# Patient Record
Sex: Male | Born: 1970 | Race: Black or African American | Hispanic: No | Marital: Single | Smoking: Former smoker
Health system: Southern US, Community
[De-identification: ages and names within clinical notes are randomized; demographics above are authoritative.]

## PROBLEM LIST (undated history)

## (undated) DIAGNOSIS — E119 Type 2 diabetes mellitus without complications: Secondary | ICD-10-CM

## (undated) DIAGNOSIS — I10 Essential (primary) hypertension: Secondary | ICD-10-CM

## (undated) HISTORY — DX: Type 2 diabetes mellitus without complications: E11.9

---

## 2004-05-31 ENCOUNTER — Emergency Department (HOSPITAL_COMMUNITY): Admission: EM | Admit: 2004-05-31 | Discharge: 2004-05-31 | Payer: Self-pay | Admitting: Emergency Medicine

## 2005-11-11 ENCOUNTER — Emergency Department (HOSPITAL_COMMUNITY): Admission: EM | Admit: 2005-11-11 | Discharge: 2005-11-11 | Payer: Self-pay | Admitting: Emergency Medicine

## 2006-03-12 ENCOUNTER — Emergency Department: Payer: Self-pay | Admitting: Emergency Medicine

## 2007-05-21 ENCOUNTER — Emergency Department: Payer: Self-pay | Admitting: Emergency Medicine
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
## 2010-09-26 ENCOUNTER — Emergency Department (HOSPITAL_COMMUNITY)
Admission: EM | Admit: 2010-09-26 | Discharge: 2010-09-26 | Payer: Self-pay | Source: Home / Self Care | Admitting: Emergency Medicine

## 2010-09-30 LAB — URINALYSIS, ROUTINE W REFLEX MICROSCOPIC
Bilirubin Urine: NEGATIVE
Hgb urine dipstick: NEGATIVE
Ketones, ur: NEGATIVE mg/dL
Leukocytes, UA: NEGATIVE
Nitrite: NEGATIVE
Protein, ur: NEGATIVE mg/dL
Specific Gravity, Urine: 1.021 (ref 1.005–1.030)
Urine Glucose, Fasting: >1000 mg/dL — AB
Urobilinogen, UA: 1 mg/dL (ref 0.0–1.0)
pH: 6 (ref 5.0–8.0)

## 2010-09-30 LAB — POCT I-STAT, CHEM 8
BUN: 6 mg/dL (ref 6–23)
Calcium, Ion: 1.19 mmol/L (ref 1.12–1.32)
Chloride: 103 mEq/L (ref 96–112)
Creatinine, Ser: 1 mg/dL (ref 0.4–1.5)
Glucose, Bld: 340 mg/dL — ABNORMAL HIGH (ref 70–99)
HCT: 50 % (ref 39.0–52.0)
Hemoglobin: 17 g/dL (ref 13.0–17.0)
Potassium: 3.9 mEq/L (ref 3.5–5.1)
Sodium: 138 mEq/L (ref 135–145)
TCO2: 26 mmol/L (ref 0–100)

## 2010-09-30 LAB — GLUCOSE, CAPILLARY
Glucose-Capillary: 314 mg/dL — ABNORMAL HIGH (ref 70–99)
Glucose-Capillary: 393 mg/dL — ABNORMAL HIGH (ref 70–99)
Glucose-Capillary: 288 mg/dL — ABNORMAL HIGH (ref 70–99)

## 2010-09-30 LAB — URINE MICROSCOPIC-ADD ON

## 2013-04-04 ENCOUNTER — Ambulatory Visit: Payer: Self-pay

## 2013-04-15 ENCOUNTER — Ambulatory Visit: Payer: Self-pay

## 2013-04-25 DIAGNOSIS — R3915 Urgency of urination: Secondary | ICD-10-CM | POA: Insufficient documentation

## 2013-04-25 DIAGNOSIS — N411 Chronic prostatitis: Secondary | ICD-10-CM | POA: Insufficient documentation

## 2014-07-13 ENCOUNTER — Encounter (HOSPITAL_COMMUNITY): Payer: Self-pay | Admitting: Emergency Medicine

## 2014-07-13 ENCOUNTER — Emergency Department (HOSPITAL_COMMUNITY)
Admission: EM | Admit: 2014-07-13 | Discharge: 2014-07-13 | Disposition: A | Discharge status: Home / Self Care | Payer: BC Managed Care – PPO | Attending: Emergency Medicine | Admitting: Emergency Medicine

## 2014-07-13 DIAGNOSIS — N451 Epididymitis: Secondary | ICD-10-CM | POA: Insufficient documentation

## 2014-07-13 DIAGNOSIS — Z202 Contact with and (suspected) exposure to infections with a predominantly sexual mode of transmission: Secondary | ICD-10-CM | POA: Diagnosis present

## 2014-07-13 LAB — URINALYSIS, ROUTINE W REFLEX MICROSCOPIC
Bilirubin Urine: NEGATIVE
Glucose, UA: NEGATIVE mg/dL
Hgb urine dipstick: NEGATIVE
Ketones, ur: NEGATIVE mg/dL
Leukocytes, UA: NEGATIVE
Nitrite: NEGATIVE
Protein, ur: NEGATIVE mg/dL
Specific Gravity, Urine: 1.011 (ref 1.005–1.030)
Urobilinogen, UA: 0.2 mg/dL (ref 0.0–1.0)
pH: 5 (ref 5.0–8.0)

## 2014-07-13 MED ORDER — LIDOCAINE HCL 1 % IJ SOLN
INTRAMUSCULAR | Status: AC
  Administered 2014-07-13: 20 mL
  Filled 2014-07-13: qty 20

## 2014-07-13 MED ORDER — DOXYCYCLINE HYCLATE 100 MG PO CAPS: 100.0000 mg | ORAL_CAPSULE | Freq: Two times a day (BID) | ORAL | Status: DC

## 2014-07-13 MED ORDER — METRONIDAZOLE 500 MG PO TABS
2000.0000 mg | ORAL_TABLET | Freq: Once | ORAL | Status: AC
  Administered 2014-07-13: 2000 mg via ORAL
  Filled 2014-07-13: qty 4

## 2014-07-13 MED ORDER — CEFTRIAXONE SODIUM 250 MG IJ SOLR
250.0000 mg | Freq: Once | INTRAMUSCULAR | Status: AC
  Administered 2014-07-13: 250 mg via INTRAMUSCULAR
  Filled 2014-07-13: qty 250

## 2014-07-13 NOTE — ED Provider Notes (Signed)
Medical screening examination/treatment/procedure(s) were performed by non-physician practitioner and as supervising physician I was immediately available for consultation/collaboration.   EKG Interpretation None       Olivia Mackielga M Aryella Besecker, MD 07/13/14 [id number]

## 2014-07-13 NOTE — ED Notes (Signed)
Pt states he has burning and discharge from his penis  Pt states sxs started 2 days ago

## 2014-07-13 NOTE — ED Provider Notes (Signed)
CSN: [id number]     Arrival date & time 07/13/14  0223 History   First MD Initiated Contact with Patient 07/13/14 [phone]     Chief Complaint  Patient presents with  . SEXUALLY TRANSMITTED DISEASE   Patient is a 43 y.o. male presenting with penile discharge.  Penile Discharge This is a new problem. The current episode started in the past 7 days. Episode frequency: before and after urination. The problem has been gradually worsening. Pertinent negatives include no abdominal pain, anorexia, arthralgias, change in bowel habit, chest pain, chills, congestion, coughing, diaphoresis, fatigue, fever, headaches, joint swelling, myalgias, nausea, neck pain, numbness, rash, swollen glands, urinary symptoms, vertigo, visual change, vomiting or weakness.    History reviewed. No pertinent past medical history. History reviewed. No pertinent past surgical history. History reviewed. No pertinent family history. History  Substance Use Topics  . Smoking status: Never Smoker   . Smokeless tobacco: Not on file  . Alcohol Use: Yes    Review of Systems  Constitutional: Negative for fever, chills, diaphoresis and fatigue.  HENT: Negative for congestion.   Respiratory: Negative for cough.   Cardiovascular: Negative for chest pain.  Gastrointestinal: Negative for nausea, vomiting, abdominal pain, anorexia and change in bowel habit.  Genitourinary: Positive for dysuria, urgency, frequency, discharge and testicular pain. Negative for hematuria, flank pain, decreased urine volume, penile swelling, scrotal swelling, difficulty urinating, genital sores and penile pain.  Musculoskeletal: Negative for arthralgias, joint swelling, myalgias and neck pain.  Skin: Negative for rash.  Neurological: Negative for vertigo, weakness, numbness and headaches.  All other systems reviewed and are negative.     Allergies  Review of patient's allergies indicates no known allergies.  Home Medications   Prior to Admission  medications   Medication Sig Start Date End Date Taking? Authorizing Provider  doxycycline (VIBRAMYCIN) 100 MG capsule Take 1 capsule (100 mg total) by mouth 2 (two) times daily. 07/13/14   Coti Burd A Forcucci, PA-C   BP 123/77  Pulse 79  Temp(Src) 98 F (36.7 C) (Oral)  Resp 16  SpO2 97% Physical Exam  Nursing note and vitals reviewed. Constitutional: He is oriented to person, place, and time. He appears well-developed and well-nourished. No distress.  HENT:  Head: Normocephalic and atraumatic.  Mouth/Throat: Oropharynx is clear and moist. No oropharyngeal exudate.  Eyes: Conjunctivae and EOM are normal. Pupils are equal, round, and reactive to light. No scleral icterus.  Neck: Normal range of motion. Neck supple. No JVD present. No thyromegaly present.  Cardiovascular: Normal rate, regular rhythm, normal heart sounds and intact distal pulses.  Exam reveals no gallop and no friction rub.   No murmur heard. Pulmonary/Chest: Effort normal and breath sounds normal. No respiratory distress. He has no wheezes. He has no rales. He exhibits no tenderness.  Abdominal: Soft. Bowel sounds are normal. He exhibits no distension and no mass. There is no tenderness. There is no rebound and no guarding.  Genitourinary: Right testis shows tenderness. Right testis shows no mass and no swelling. Right testis is descended. Cremasteric reflex is not absent on the right side. Left testis shows no mass, no swelling and no tenderness. Left testis is descended. Cremasteric reflex is not absent on the left side. Circumcised. No phimosis, paraphimosis, hypospadias, penile erythema or penile tenderness. Discharge found.  Musculoskeletal: Normal range of motion.  Lymphadenopathy:    He has no cervical adenopathy.  Neurological: He is alert and oriented to person, place, and time.  Skin: Skin is warm and dry.  He is not diaphoretic.  Psychiatric: He has a normal mood and affect. His behavior is normal. Judgment and  thought content normal.    ED Course  Procedures (including critical care time) Labs Review Labs Reviewed  GC/CHLAMYDIA PROBE AMP  URINALYSIS, ROUTINE W REFLEX MICROSCOPIC    Imaging Review No results found.   EKG Interpretation None      MDM   Final diagnoses:  Epididymitis, right   Patient is a 43 y.o. Male who presents to the ED with penile discharge.  Physical exam reveals mildly tender right testicle with no other acute abnormalities.  UA is pending.  GC is pending.  Will treat presumptively with 250 mg ceftriaxone IM, 2 g flagyl, and doxycycline 100 mg PO BID.  Patient to return for worsening testicular pain, fever, chills, nausea, or vomiting.  [facility]  Patient to abstain from sexual activity until he no longer has any further symptoms and finishes the course of his antibiotics.  Patient discussed with Dr. Norlene Campbelltter who agrees with the above therapy and discharge.      Eben Burowourtney A Forcucci, PA-C 07/13/14 [phone]

## 2014-07-13 NOTE — Discharge Instructions (Signed)
Epididymitis Epididymitis is a swelling (inflammation) of the epididymis. The epididymis is a cord-like structure along the back part of the testicle. Epididymitis is usually, but not always, caused by infection. This is usually a sudden problem beginning with chills, fever and pain behind the scrotum and in the testicle. There may be swelling and redness of the testicle. DIAGNOSIS  Physical examination will reveal a tender, swollen epididymis. Sometimes, cultures are obtained from the urine or from prostate secretions to help find out if there is an infection or if the cause is a different problem. Sometimes, blood work is performed to see if your white blood cell count is elevated and if a germ (bacterial) or viral infection is present. Using this knowledge, an appropriate medicine which kills germs (antibiotic) can be chosen by your caregiver. A viral infection causing epididymitis will most often go away (resolve) without treatment. HOME CARE INSTRUCTIONS   Hot sitz baths for 20 minutes, 4 times per day, may help relieve pain.  Only take over-the-counter or prescription medicines for pain, discomfort or fever as directed by your caregiver.  Take all medicines, including antibiotics, as directed. Take the antibiotics for the full prescribed length of time even if you are feeling better.  It is very important to keep all follow-up appointments. SEEK IMMEDIATE MEDICAL CARE IF:   You have a fever.  You have pain not relieved with medicines.  You have any worsening of your problems.  Your pain seems to come and go.  You develop pain, redness, and swelling in the scrotum and surrounding areas. MAKE SURE YOU:   Understand these instructions.  Will watch your condition.  Will get help right away if you are not doing well or get worse. Document Released: 08/29/2000 Document Revised: 11/24/2011 Document Reviewed: 07/19/2009 [facility] Patient Information 2015 [place]Care, [place]. This information  is not intended to replace advice given to you by your health care provider. Make sure you discuss any questions you have with your health care provider.  Sexually Transmitted Disease A sexually transmitted disease (STD) is a disease or infection that may be passed (transmitted) from person to person, usually during sexual activity. This may happen by way of saliva, semen, blood, vaginal mucus, or urine. Common STDs include:   Gonorrhea.   Chlamydia.   Syphilis.   HIV and AIDS.   Genital herpes.   Hepatitis B and C.   Trichomonas.   Human papillomavirus (HPV).   Pubic lice.   Scabies.  Mites.  Bacterial vaginosis. WHAT ARE CAUSES OF STDs? An STD may be caused by bacteria, a virus, or parasites. STDs are often transmitted during sexual activity if one person is infected. However, they may also be transmitted through nonsexual means. STDs may be transmitted after:   Sexual intercourse with an infected person.   Sharing sex toys with an infected person.   Sharing needles with an infected person or using unclean piercing or tattoo needles.  Having intimate contact with the genitals, mouth, or rectal areas of an infected person.   Exposure to infected fluids during birth. WHAT ARE THE SIGNS AND SYMPTOMS OF STDs? Different STDs have different symptoms. Some people may not have any symptoms. If symptoms are present, they may include:   Painful or bloody urination.   Pain in the pelvis, abdomen, vagina, anus, throat, or eyes.   A skin rash, itching, or irritation.  Growths, ulcerations, blisters, or sores in the genital and anal areas.  Abnormal vaginal discharge with or without bad odor.  Penile discharge in men.   Fever.   Pain or bleeding during sexual intercourse.   Swollen glands in the groin area.   Yellow skin and eyes (jaundice). This is seen with hepatitis.   Swollen testicles.  Infertility.  Sores and blisters in the  mouth. HOW ARE STDs DIAGNOSED? To make a diagnosis, your health care provider may:   Take a medical history.   Perform a physical exam.   Take a sample of any discharge to examine.  Swab the throat, cervix, opening to the penis, rectum, or vagina for testing.  Test a sample of your first morning urine.   Perform blood tests.   Perform a Pap test, if this applies.   Perform a colposcopy.   Perform a laparoscopy.  HOW ARE STDs TREATED? Treatment depends on the STD. Some STDs may be treated but not cured.   Chlamydia, gonorrhea, trichomonas, and syphilis can be cured with antibiotic medicine.   Genital herpes, hepatitis, and HIV can be treated, but not cured, with prescribed medicines. The medicines lessen symptoms.   Genital warts from HPV can be treated with medicine or by freezing, burning (electrocautery), or surgery. Warts may come back.   HPV cannot be cured with medicine or surgery. However, abnormal areas may be removed from the cervix, vagina, or vulva.   If your diagnosis is confirmed, your recent sexual partners need treatment. This is true even if they are symptom-free or have a negative culture or evaluation. They should not have sex until their health care providers say it is okay. HOW CAN I REDUCE MY RISK OF GETTING AN STD? Take these steps to reduce your risk of getting an STD:  Use latex condoms, dental dams, and water-soluble lubricants during sexual activity. Do not use petroleum jelly or oils.  Avoid having multiple sex partners.  Do not have sex with someone who has other sex partners.  Do not have sex with anyone you do not know or who is at high risk for an STD.  Avoid risky sex practices that can break your skin.  Do not have sex if you have open sores on your mouth or skin.  Avoid drinking too much alcohol or taking illegal drugs. Alcohol and drugs can affect your judgment and put you in a vulnerable position.  Avoid engaging in oral  and anal sex acts.  Get vaccinated for HPV and hepatitis. If you have not received these vaccines in the past, talk to your health care provider about whether one or both might be right for you.   If you are at risk of being infected with HIV, it is recommended that you take a prescription medicine daily to prevent HIV infection. This is called pre-exposure prophylaxis (PrEP). You are considered at risk if:  You are a man who has sex with other men (MSM).  You are a heterosexual man or woman and are sexually active with more than one partner.  You take drugs by injection.  You are sexually active with a partner who has HIV.  Talk with your health care provider about whether you are at high risk of being infected with HIV. If you choose to begin PrEP, you should first be tested for HIV. You should then be tested every 3 months for as long as you are taking PrEP.  WHAT SHOULD I DO IF I THINK I HAVE AN STD?  See your health care provider.   Tell your sexual partner(s). They should be tested and treated  for any STDs.  Do not have sex until your health care provider says it is okay. WHEN SHOULD I GET IMMEDIATE MEDICAL CARE? Contact your health care provider right away if:   You have severe abdominal pain.  You are a man and notice swelling or pain in your testicles.  You are a woman and notice swelling or pain in your vagina. Document Released: 11/22/2002 Document Revised: 09/06/2013 Document Reviewed: 03/22/2013 [facility] Patient Information 2015 [place]ExitCare, [place]LLC. This information is not intended to replace advice given to you by your health care provider. Make sure you discuss any questions you have with your health care provider.   Emergency Department Resource Guide 1) Find a Doctor and Pay Out of Pocket Although you won't have to find out who is covered by your insurance plan, it is a good idea to ask around and get recommendations. You will then need to call the office and see  if the doctor you have chosen will accept you as a new patient and what types of options they offer for patients who are self-pay. Some doctors offer discounts or will set up payment plans for their patients who do not have insurance, but you will need to ask so you aren't surprised when you get to your appointment.  2) Contact Your Local Health Department Not all health departments have doctors that can see patients for sick visits, but many do, so it is worth a call to see if yours does. If you don't know where your local health department is, you can check in your phone book. The CDC also has a tool to help you locate your state's health department, and many state websites also have listings of all of their local health departments.  3) Find a Walk-in Clinic If your illness is not likely to be very severe or complicated, you may want to try a walk in clinic. These are popping up all over the country in pharmacies, drugstores, and shopping centers. They're usually staffed by nurse practitioners or physician assistants that have been trained to treat common illnesses and complaints. They're usually fairly quick and inexpensive. However, if you have serious medical issues or chronic medical problems, these are probably not your best option.  No Primary Care Doctor: - Call Health Connect at  [phone] - they can help you locate a primary care doctor that  accepts your insurance, provides certain services, etc. - Physician Referral Service- [phone]  Chronic Pain Problems: Organization         Address  Phone   Notes  [facility]  [phone] Patients need to be referred by their primary care doctor.   Medication Assistance: Organization         Address  Phone   Notes  [facility] Medication [facility] [address] [place], [place] [address] [phone] --Must be a resident of [facility] -- Must have NO insurance coverage whatsoever (no  Medicaid/ Medicare, etc.) -- The pt. MUST have a primary care doctor that directs their care regularly and follows them in the community   MedAssist  [phone]   [employer]  [phone]    Agencies that provide inexpensive medical care: Organization         Address  Phone   Notes  [facility]  [phone]   [facility]    [phone]    [facility] [address] [place], [place] [address] [phone]   [facility] [address] [place] [phone]   Planned Parenthood    [phone]   [facility]    [phone]   [facility]  [address], [place] Phone:  [phone], Fax:  [phone] Hours of Operation:  9 am - 6 pm, M-F.  Also accepts Medicaid/Medicare and self-pay.  [facility]  [address], [place] Phone: [phone], Fax: [phone]. Hours of Operation:  8:30 am - 5:30 pm, M-F.  Also accepts Medicaid and self-pay.  [facility] [place] [address], [place] Phone: [phone]   [facility] [address] Natasha Bence [place], [place] [phone], Ext. [phone] Mondays & Thursdays: 7-9 AM.  First 15 patients are seen on a first come, first serve basis.    Medicaid-accepting [facility] Providers:  Organization         Address  Phone   Notes  [facility] [address], [place] [phone] Also accepts self-pay patients.  [facility] [address] Laurell Josephs [place], [place]  [phone]   [facility] [address], [place] [phone]   [facility] [address] [place] [phone]   Renaye Rakers [address], [place]   [phone] Only accepts [place] Access [place] patients after they have their name applied to their card.    Self-Pay (no insurance) in [facility]:  Organization         Address  Phone   Notes  Sickle Cell Patients, [facility] [address] [place], [place] [phone]   [facility] [address] [place], [place] [phone]   [facility] [place]  [address], [place] [phone]   [facility]/Dr. Osei-Bonsu  [address] [place] or [address], [place] [phone] Phone number for both [place] and [place] locations is the same.  [facility] [address], [place] [phone]   [facility] [address] [place] or [address] [phone]   [facility] [address], [place] [phone], phone; [phone], fax Sees patients 1st and 3rd Saturday of every month.  Must not qualify for public or private insurance (i.e. Medicaid, Medicare, [place] Health Choice, Veterans' Benefits)  Household income should be no more than 200% of the poverty level The clinic cannot treat you if you are pregnant or think you are pregnant  Sexually transmitted diseases are not treated at the clinic.    Dental Care: Organization         Address  Phone  Notes  [facility] [address] [place], [place] [phone] Accepts children up to age 17 who are enrolled in [place] or [place] Health Choice; pregnant women with a Medicaid card; and children who have applied for Medicaid or [place] Health Choice, but were declined, whose parents can pay a reduced fee at time of service.  [facility]  [address], [place] [phone] Accepts children up to age 74 who are enrolled in [place] or [place] Health Choice; pregnant women with a  Medicaid card; and children who have applied for Medicaid or [place] Health Choice,  but were declined, whose parents can pay a reduced fee at time of service.  [facility]  [address], [place] [phone] Patients are seen by appointment only. Walk-ins are not accepted. [facility] will see patients 45 years of age and older. Monday - Tuesday (8am-5pm) Most Wednesdays (8:30-5pm) $30 per visit, cash only  [facility]  [address], [facility] [place] [phone] Patients are seen by appointment only. Walk-ins are not accepted. [facility] will see patients 518 years of age and older. One Wednesday Evening (Monthly: Volunteer Based).  $30 per visit, cash only  [employer] [facility] [facility]  [phone] for adults; Children under age 44, call Graduate Pediatric Dentistry at [phone]. Children aged 324-14, please call [phone] to request a pediatric application.  Dental services are provided in all areas of dental care including fillings, crowns and bridges, complete and partial dentures, implants, gum treatment, root canals, and extractions. Preventive care is also provided. Treatment is provided to both adults and children. Patients are selected via a lottery and there is often a waiting list.   [facility] [address], [place]  [phone] [email or web address]   [facility] [address], [place], [place] [phone], Ext. [phone] Second and Fourth Thursday of each month, opens at 6:30 AM; Clinic ends at 9 AM.  Patients are seen on a first-come first-served basis, and a limited number are seen during each clinic.   [facility]  [address], [place], [place] [phone]   Eligibility Requirements You must have lived in [place], [place], or [place] counties for at least the last three months.   You cannot be eligible for state or federal sponsored National Cityhealthcare insurance, including CIGNAVeterans Administration, [place]icaid, or Harrah[employer] EntertainmentMedicare.   You generally  cannot be eligible for healthcare insurance through your employer.    How to apply: Eligibility screenings are held every Tuesday and Wednesday afternoon from 1:00 pm until 4:00 pm. You do not need an appointment for the interview!  [facility] [address], [place], [place] [phone]   [facility]  [phone]   [facility]  [phone]   [facility]  [phone]    Behavioral Health Resources in the Community: Intensive Outpatient Programs Organization         Address  Phone  Notes  [facility] [address], [place], [place] [phone]   [facility] [address], [place], [place] [phone]   [facility] [address], [place], [place]  [phone]   [facility] [address],  [place], [place] [phone] or [phone]   Substance Abuse Resources Organization         Address  Phone  Notes  [facility] Drug Services  [phone]   [facility]  [phone]   [facility]  [phone]   [facility]  [phone]   Residential & Outpatient Substance Abuse Program  [phone]   Psychological Services Organization         Address  Phone  Notes  [facility]  [phone]- [phone]   [facility]  [phone]- [phone]   [facility] [address], [place] [phone] or [phone]    Mobile Crisis Teams Organization  Address  Phone  Notes  [facility], Mobile Crisis Care Unit  [phone]   [facility]otherapeutic [facility]  [address] [place], [place]   [facility] [address], [place] [phone]    Self-Help/Support Groups Organization         Address  Phone             Notes  [place] of [place] - variety of support groups  [place] Call for more  information  Narcotics Anonymous (NA), [facility] [address], [employer] [place]  2 meetings at this location   Special educational needs teacher         Address  Phone  Notes  [facility]AP Residential Treatment [place],    [place]  [phone]   [facility]  [address] [place] [phone], [place], [place]   [place], [place] [phone] Admissions: 8am-3pm M-F  Incentives Substance [place] [address]    [place], [place] [phone]   [facility] [address] [place], [place], [place]   [facility] [address]  [place], [place]   [facility] - Intensive Outpatient [address], [place], [place]   [facility] ([place].,  [place], [place] [phone] or [phone]   [facility] (RTS) [address] [place], [place] Accepts Medicaid  [facility] [address],  [place] [phone] Substance Abuse/Addiction Treatment   [facility] Organization         Address  Phone  Notes  [facility]  [phone]   Domenic Schwab, PhD [address] Arlis Porta [place], [place]   [phone] or [phone]   [place], [place] [phone]   [facility] [address] [place], [place] [phone] Insurance/Medicaid/sponsorship through [facility] and Families [address], [address]                                    [place], [place] [phone] [place] [address], [place] [phone]    Dr. Adele Schilder  [phone]   [facility] Dept. 1) [address] [place] 2) [place] 3)  [place] [address], [place] [phone]  [phone]   [place] [phone] or [phone] (After Hours)

## 2014-07-14 LAB — GC/CHLAMYDIA PROBE AMP
CT Probe RNA: NEGATIVE
GC Probe RNA: NEGATIVE

## 2014-09-26 ENCOUNTER — Ambulatory Visit (INDEPENDENT_AMBULATORY_CARE_PROVIDER_SITE_OTHER): Payer: BLUE CROSS/BLUE SHIELD | Admitting: Internal Medicine

## 2014-09-26 ENCOUNTER — Encounter: Payer: Self-pay | Admitting: Internal Medicine

## 2014-09-26 VITALS — BP 118/84 | HR 84 | Temp 98.0°F | Ht 65.5 in | Wt 178.0 lb

## 2014-09-26 DIAGNOSIS — E119 Type 2 diabetes mellitus without complications: Secondary | ICD-10-CM

## 2014-09-26 DIAGNOSIS — E1165 Type 2 diabetes mellitus with hyperglycemia: Secondary | ICD-10-CM | POA: Insufficient documentation

## 2014-09-26 DIAGNOSIS — Z13220 Encounter for screening for lipoid disorders: Secondary | ICD-10-CM

## 2014-09-26 DIAGNOSIS — R300 Dysuria: Secondary | ICD-10-CM

## 2014-09-26 LAB — HEMOGLOBIN A1C: Hgb A1c MFr Bld: 10.2 % — ABNORMAL HIGH (ref 4.6–6.5)

## 2014-09-26 LAB — COMPREHENSIVE METABOLIC PANEL
ALT: 21 U/L (ref 0–53)
AST: 19 U/L (ref 0–37)
Albumin: 3.9 g/dL (ref 3.5–5.2)
Alkaline Phosphatase: 43 U/L (ref 39–117)
BUN: 11 mg/dL (ref 6–23)
CO2: 27 mEq/L (ref 19–32)
Calcium: 9 mg/dL (ref 8.4–10.5)
Chloride: 99 mEq/L (ref 96–112)
Creatinine, Ser: 1.1 mg/dL (ref 0.4–1.5)
GFR: 91.88 mL/min (ref >60.00)
Glucose, Bld: 451 mg/dL — ABNORMAL HIGH (ref 70–99)
Potassium: 3.7 mEq/L (ref 3.5–5.1)
Sodium: 130 mEq/L — ABNORMAL LOW (ref 135–145)
Total Bilirubin: 1.7 mg/dL — ABNORMAL HIGH (ref 0.2–1.2)
Total Protein: 6.9 g/dL (ref 6.0–8.3)

## 2014-09-26 LAB — LIPID PANEL
Cholesterol: 182 mg/dL (ref 0–200)
HDL: 44.9 mg/dL (ref >39.00)
LDL Cholesterol: 99 mg/dL (ref 0–99)
NonHDL: 137.1
Total CHOL/HDL Ratio: 4
Triglycerides: 191 mg/dL — ABNORMAL HIGH (ref 0.0–149.0)
VLDL: 38.2 mg/dL (ref 0.0–40.0)

## 2014-09-26 LAB — CBC
HCT: 47.3 % (ref 39.0–52.0)
Hemoglobin: 15.7 g/dL (ref 13.0–17.0)
MCHC: 33.2 g/dL (ref 30.0–36.0)
MCV: 94.5 fl (ref 78.0–100.0)
Platelets: 179 10*3/uL (ref 150.0–400.0)
RBC: 5.01 Mil/uL (ref 4.22–5.81)
RDW: 12.7 % (ref 11.5–15.5)
WBC: 5.9 10*3/uL (ref 4.0–10.5)

## 2014-09-26 NOTE — Progress Notes (Signed)
Pre visit review using our clinic review tool, if applicable. No additional management support is needed unless otherwise documented below in the visit note. 

## 2014-09-26 NOTE — Patient Instructions (Signed)

## 2014-09-26 NOTE — Progress Notes (Signed)
HPI  Pt presents to the clinic today to establish care. His last PCP was at [facility] but he is not sure of his providers name.  DM2: Diagnosed in 2012. Was on Glipizide and Metformin at one time but reports he stopped his medication 8 months ago. He did join a gym but did not lose weight but reports he thought he could just stop his medication. He does check his sugars post prandial. They range 160-330.  Additionally, he c/u dysuria. He reports this started 2-3 months ago. He was seen in the ED 06/2014 and treated with antibiotics but reports he continues to have dysuria. He denies pain in his testicles. He denies penile discharge. He denies fever, chills or nausea. He has not tried anything OTC.  Flu: never Pneumovax: never Tetanus: > 10 years Vision: yearly Dentist: biannually  Past Medical History  Diagnosis Date  . Diabetes mellitus without complication     No current outpatient prescriptions on file.   No current facility-administered medications for this visit.    No Known Allergies  No family history on file.  History   Social History  . Marital Status: Single    Spouse Name: N/A    Number of Children: N/A  . Years of Education: N/A   Occupational History  . Not on file.   Social History Main Topics  . Smoking status: Never Smoker   . Smokeless tobacco: Never Used  . Alcohol Use: 0.0 oz/week    0 Not specified per week     Comment: occasional  . Drug Use: No  . Sexual Activity: Yes    Birth Control/ Protection: None   Other Topics Concern  . Not on file   Social History Narrative    ROS:  Constitutional: Denies fever, malaise, fatigue, headache or abrupt weight changes.  HEENT: Pt reports blurred vision. Denies eye pain, eye redness, ear pain, ringing in the ears, wax buildup, runny nose, nasal congestion, bloody nose, or sore throat. Respiratory: Denies difficulty breathing, shortness of breath, cough or sputum production.   Cardiovascular:  Denies chest pain, chest tightness, palpitations or swelling in the hands or feet.  Gastrointestinal: Pt reports increased thirst and polyuria. Denies abdominal pain, bloating, constipation, diarrhea or blood in the stool.  GU: Pt reports dysuria. Denies frequency, urgency, blood in urine, odor or discharge. Musculoskeletal: Denies decrease in range of motion, difficulty with gait, muscle pain or joint pain and swelling.  Skin: Denies redness, rashes, lesions or ulcercations.  Neurological: Pt reports numbness and tingling in arms. Denies dizziness, difficulty with memory, difficulty with speech or problems with balance and coordination.   No other specific complaints in a complete review of systems (except as listed in HPI above).  PE:  BP 118/84 mmHg  Pulse 84  Temp(Src) 98 F (36.7 C) (Oral)  Ht 5' 5.5" (1.664 m)  Wt 178 lb (80.74 kg)  BMI 29.16 kg/m2  SpO2 98%  Wt Readings from Last 3 Encounters:  09/26/14 178 lb (80.74 kg)    General: Appears his stated age, well developed, well nourished in NAD. Skin: Warm, dry and intact. No lesion or ulcerations noted. HEENT: Head: normal shape and size; Eyes: sclera white, no icterus, conjunctiva pink, PERRLA and EOMs intact;  Cardiovascular: Normal rate and rhythm. S1,S2 noted.  No murmur, rubs or gallops noted. No JVD or BLE edema. No carotid bruits noted. Pulmonary/Chest: Normal effort and positive vesicular breath sounds. No respiratory distress. No wheezes, rales or ronchi noted.  Abdomen:  Soft and nontender. Normal bowel sounds, no bruits noted. No distention or masses noted. Liver, spleen and kidneys non palpable. Neurological: Alert and oriented. Sensation intact to BUE/BLE. Coordination normal.    BMET    Component Value Date/Time   NA 138 09/26/2010 1027   K 3.9 09/26/2010 1027   CL 103 09/26/2010 1027   GLUCOSE 340* 09/26/2010 1027   BUN 6 09/26/2010 1027   CREATININE 1.0 09/26/2010 1027    Lipid Panel  No results  found for: CHOL, TRIG, HDL, CHOLHDL, VLDL, LDLCALC  CBC    Component Value Date/Time   HGB 17.0 09/26/2010 1027   HCT 50.0 09/26/2010 1027    Hgb A1C No results found for: HGBA1C   Assessment and Plan:  Dysuria:  Urinalysis: > 3000 glucose, + ketones No infection noted Likely d/t ketones Push fluids We will work on diabetes control

## 2014-09-26 NOTE — Assessment & Plan Note (Addendum)
Will check A1C today Discussed low carb diet and exercise He is getting yearly eye exam Foot exam today He declines flu, tetanus and pneumonia today  If A1C elevated, will restart metformin and glipizide  RTC in 3 months to recheck A1C

## 2014-09-27 MED ORDER — METFORMIN HCL 1000 MG PO TABS: ORAL_TABLET | ORAL | Status: DC

## 2014-09-27 NOTE — Addendum Note (Signed)
Addended by: Roena MaladyEVONTENNO, Xanthe Couillard Y on: 09/27/2014 04:53 PM   Modules accepted: Orders

## 2014-10-30 LAB — POCT URINALYSIS DIPSTICK
Bilirubin, UA: NEGATIVE
Blood, UA: NEGATIVE
Glucose, UA: 3000
Ketones, UA: POSITIVE
Leukocytes, UA: NEGATIVE
Nitrite, UA: NEGATIVE
Spec Grav, UA: 1.015
Urobilinogen, UA: NEGATIVE
pH, UA: 6

## 2014-10-30 NOTE — Addendum Note (Signed)
Addended by: Roena MaladyEVONTENNO, MELANIE Y on: 10/30/2014 12:11 PM   Modules accepted: Orders

## 2014-12-26 ENCOUNTER — Ambulatory Visit (INDEPENDENT_AMBULATORY_CARE_PROVIDER_SITE_OTHER): Payer: BLUE CROSS/BLUE SHIELD | Admitting: Internal Medicine

## 2014-12-26 ENCOUNTER — Encounter: Payer: Self-pay | Admitting: Internal Medicine

## 2014-12-26 VITALS — BP 130/76 | HR 93 | Temp 98.2°F | Wt 174.0 lb

## 2014-12-26 DIAGNOSIS — E781 Pure hyperglyceridemia: Secondary | ICD-10-CM | POA: Diagnosis not present

## 2014-12-26 DIAGNOSIS — E119 Type 2 diabetes mellitus without complications: Secondary | ICD-10-CM

## 2014-12-26 LAB — HEMOGLOBIN A1C: Hgb A1c MFr Bld: 9.1 % — ABNORMAL HIGH (ref 4.6–6.5)

## 2014-12-26 LAB — LIPID PANEL
Cholesterol: 155 mg/dL (ref 0–200)
HDL: 49.9 mg/dL (ref >39.00)
LDL Cholesterol: 80 mg/dL (ref 0–99)
NonHDL: 105.1
Total CHOL/HDL Ratio: 3
Triglycerides: 125 mg/dL (ref 0.0–149.0)
VLDL: 25 mg/dL (ref 0.0–40.0)

## 2014-12-26 LAB — MICROALBUMIN / CREATININE URINE RATIO
Creatinine,U: 37.5 mg/dL
Microalb Creat Ratio: 12.5 mg/g (ref 0.0–30.0)
Microalb, Ur: 4.7 mg/dL — ABNORMAL HIGH (ref 0.0–1.9)

## 2014-12-26 NOTE — Progress Notes (Signed)
Subjective:    Patient ID: Gordon Sexton, male    DOB: 09/14/1971, 44 y.o.   MRN: [id number]  HPI  Pt presents to the clinic today for 3 month follow up of  DM2. He was restarted on Metformin 09/26/14 for an A1C of 10.2%. At that time, he also had some elevated triglycerides of 191, LDL of 99. He was instructed to start taking Fish Oil daily. He has been taking both medications as directed. He denies adverse effects. His sugars range 80-300. Eye exam was 1 year ago. Microalbumin has been > 1 year ago. Flu shot declined: Pneumovax: declined.   Review of Systems      Past Medical History  Diagnosis Date  . Diabetes mellitus without complication     Current Outpatient Prescriptions  Medication Sig Dispense Refill  . metFORMIN (GLUCOPHAGE) 1000 MG tablet 1/2 tablet two times daily for 2 weeks then increase to 1 tablet two times daily 60 tablet 2   No current facility-administered medications for this visit.    No Known Allergies  Family History  Problem Relation Age of Onset  . Diabetes Sister   . Stroke Maternal Grandmother   . Cancer Neg Hx   . Hyperlipidemia Neg Hx   . Heart disease Neg Hx     History   Social History  . Marital Status: Single    Spouse Name: N/A  . Number of Children: N/A  . Years of Education: N/A   Occupational History  . Not on file.   Social History Main Topics  . Smoking status: Never Smoker   . Smokeless tobacco: Never Used  . Alcohol Use: 0.0 oz/week    0 Standard drinks or equivalent per week     Comment: occasional  . Drug Use: No  . Sexual Activity: Yes    Birth Control/ Protection: None   Other Topics Concern  . Not on file   Social History Narrative     Constitutional: Denies fever, malaise, fatigue, headache or abrupt weight changes.  HEENT: Pt reports blurred vision. Denies eye pain, eye redness, ear pain, ringing in the ears, wax buildup, runny nose, nasal congestion, bloody nose, or sore throat. Respiratory: Denies  difficulty breathing, shortness of breath, cough or sputum production.   Cardiovascular: Denies chest pain, chest tightness, palpitations or swelling in the hands or feet.  Skin: Denies redness, rashes, lesions or ulcercations.  Neurological: Denies dizziness, difficulty with memory, difficulty with speech or problems with balance and coordination.   No other specific complaints in a complete review of systems (except as listed in HPI above).  Objective:   Physical Exam  BP 130/76 mmHg  Pulse 93  Temp(Src) 98.2 F (36.8 C) (Oral)  Wt 174 lb (78.926 kg)  SpO2 98% Wt Readings from Last 3 Encounters:  12/26/14 174 lb (78.926 kg)  09/26/14 178 lb (80.74 kg)    General: Appears his stated age, well developed, well nourished in NAD. Skin: Warm, dry and intact. No rashes, lesions or ulcerations noted. HEENT: Head: normal shape and size; Eyes: sclera white, no icterus, conjunctiva pink, PERRLA and EOMs intact; Cardiovascular: Normal rate and rhythm. S1,S2 noted.  No murmur, rubs or gallops noted.  Pulmonary/Chest: Normal effort and positive vesicular breath sounds. No respiratory distress. No wheezes, rales or ronchi noted.  Neurological: Alert and oriented. Sensation intact to BLE.   BMET    Component Value Date/Time   NA 130* 09/26/2014 1025   K 3.7 09/26/2014 1025   CL 99  09/26/2014 1025   CO2 27 09/26/2014 1025   GLUCOSE 451* 09/26/2014 1025   BUN 11 09/26/2014 1025   CREATININE 1.1 09/26/2014 1025   CALCIUM 9.0 09/26/2014 1025    Lipid Panel     Component Value Date/Time   CHOL 182 09/26/2014 1025   TRIG 191.0* 09/26/2014 1025   HDL 44.90 09/26/2014 1025   CHOLHDL 4 09/26/2014 1025   VLDL 38.2 09/26/2014 1025   LDLCALC 99 09/26/2014 1025    CBC    Component Value Date/Time   WBC 5.9 09/26/2014 1025   RBC 5.01 09/26/2014 1025   HGB 15.7 09/26/2014 1025   HCT 47.3 09/26/2014 1025   PLT 179.0 09/26/2014 1025   MCV 94.5 09/26/2014 1025   MCHC 33.2 09/26/2014  1025   RDW 12.7 09/26/2014 1025    Hgb A1C Lab Results  Component Value Date   HGBA1C 10.2* 09/26/2014         Assessment & Plan:

## 2014-12-26 NOTE — Assessment & Plan Note (Signed)
Will repeat lipid profile today Handout given on low fat diet Continue Fish oil daily

## 2014-12-26 NOTE — Patient Instructions (Signed)
Fat and Cholesterol Control Diet Fat and cholesterol levels in your blood and organs are influenced by your diet. High levels of fat and cholesterol may lead to diseases of the heart, small and large blood vessels, gallbladder, liver, and pancreas. CONTROLLING FAT AND CHOLESTEROL WITH DIET Although exercise and lifestyle factors are important, your diet is key. That is because certain foods are known to raise cholesterol and others to lower it. The goal is to balance foods for their effect on cholesterol and more importantly, to replace saturated and trans fat with other types of fat, such as monounsaturated fat, polyunsaturated fat, and omega-3 fatty acids. On average, a person should consume no more than 15 to 17 g of saturated fat daily. Saturated and trans fats are considered "bad" fats, and they will raise LDL cholesterol. Saturated fats are primarily found in animal products such as meats, butter, and cream. However, that does not mean you need to give up all your favorite foods. Today, there are good tasting, low-fat, low-cholesterol substitutes for most of the things you like to eat. Choose low-fat or nonfat alternatives. Choose round or loin cuts of red meat. These types of cuts are lowest in fat and cholesterol. Chicken (without the skin), fish, veal, and ground turkey breast are great choices. Eliminate fatty meats, such as hot dogs and salami. Even shellfish have little or no saturated fat. Have a 3 oz (85 g) portion when you eat lean meat, poultry, or fish. Trans fats are also called "partially hydrogenated oils." They are oils that have been scientifically manipulated so that they are solid at room temperature resulting in a longer shelf life and improved taste and texture of foods in which they are added. Trans fats are found in stick margarine, some tub margarines, cookies, crackers, and baked goods.  When baking and cooking, oils are a great substitute for butter. The monounsaturated oils are  especially beneficial since it is believed they lower LDL and raise HDL. The oils you should avoid entirely are saturated tropical oils, such as coconut and palm.  Remember to eat a lot from food groups that are naturally free of saturated and trans fat, including fish, fruit, vegetables, beans, grains (barley, rice, couscous, bulgur wheat), and pasta (without cream sauces).  IDENTIFYING FOODS THAT LOWER FAT AND CHOLESTEROL  Soluble fiber may lower your cholesterol. This type of fiber is found in fruits such as apples, vegetables such as broccoli, potatoes, and carrots, legumes such as beans, peas, and lentils, and grains such as barley. Foods fortified with plant sterols (phytosterol) may also lower cholesterol. You should eat at least 2 g per day of these foods for a cholesterol lowering effect.  Read package labels to identify low-saturated fats, trans fat free, and low-fat foods at the supermarket. Select cheeses that have only 2 to 3 g saturated fat per ounce. Use a heart-healthy tub margarine that is free of trans fats or partially hydrogenated oil. When buying baked goods (cookies, crackers), avoid partially hydrogenated oils. Breads and muffins should be made from whole grains (whole-wheat or whole oat flour, instead of "flour" or "enriched flour"). Buy non-creamy canned soups with reduced salt and no added fats.  FOOD PREPARATION TECHNIQUES  Never deep-fry. If you must fry, either stir-fry, which uses very little fat, or use non-stick cooking sprays. When possible, broil, bake, or roast meats, and steam vegetables. Instead of putting butter or margarine on vegetables, use lemon and herbs, applesauce, and cinnamon (for squash and sweet potatoes). Use nonfat   yogurt, salsa, and low-fat dressings for salads.  LOW-SATURATED FAT / LOW-FAT FOOD SUBSTITUTES Meats / Saturated Fat (g)  Avoid: Steak, marbled (3 oz/85 g) / 11 g  Choose: Steak, lean (3 oz/85 g) / 4 g  Avoid: Hamburger (3 oz/85 g) / 7  g  Choose: Hamburger, lean (3 oz/85 g) / 5 g  Avoid: Ham (3 oz/85 g) / 6 g  Choose: Ham, lean cut (3 oz/85 g) / 2.4 g  Avoid: Chicken, with skin, dark meat (3 oz/85 g) / 4 g  Choose: Chicken, skin removed, dark meat (3 oz/85 g) / 2 g  Avoid: Chicken, with skin, light meat (3 oz/85 g) / 2.5 g  Choose: Chicken, skin removed, light meat (3 oz/85 g) / 1 g Dairy / Saturated Fat (g)  Avoid: Whole milk (1 cup) / 5 g  Choose: Low-fat milk, 2% (1 cup) / 3 g  Choose: Low-fat milk, 1% (1 cup) / 1.5 g  Choose: Skim milk (1 cup) / 0.3 g  Avoid: Hard cheese (1 oz/28 g) / 6 g  Choose: Skim milk cheese (1 oz/28 g) / 2 to 3 g  Avoid: Cottage cheese, 4% fat (1 cup) / 6.5 g  Choose: Low-fat cottage cheese, 1% fat (1 cup) / 1.5 g  Avoid: Ice cream (1 cup) / 9 g  Choose: Sherbet (1 cup) / 2.5 g  Choose: Nonfat frozen yogurt (1 cup) / 0.3 g  Choose: Frozen fruit bar / trace  Avoid: Whipped cream (1 tbs) / 3.5 g  Choose: Nondairy whipped topping (1 tbs) / 1 g Condiments / Saturated Fat (g)  Avoid: Mayonnaise (1 tbs) / 2 g  Choose: Low-fat mayonnaise (1 tbs) / 1 g  Avoid: Butter (1 tbs) / 7 g  Choose: Extra light margarine (1 tbs) / 1 g  Avoid: Coconut oil (1 tbs) / 11.8 g  Choose: Olive oil (1 tbs) / 1.8 g  Choose: Corn oil (1 tbs) / 1.7 g  Choose: Safflower oil (1 tbs) / 1.2 g  Choose: Sunflower oil (1 tbs) / 1.4 g  Choose: Soybean oil (1 tbs) / 2.4 g  Choose: Canola oil (1 tbs) / 1 g Document Released: 09/01/2005 Document Revised: 12/27/2012 Document Reviewed: 11/30/2013 [employer] Patient Information 2015 [employer]. This information is not intended to replace advice given to you by your health care provider. Make sure you discuss any questions you have with your health care provider.  

## 2014-12-26 NOTE — Assessment & Plan Note (Signed)
Will repeat A1C and microalbumin today Encouraged him to make an eye exam He should continue Metformin at this time Advised him to continue to consume a low carb diet He declines flu and pneumonia vaccine today

## 2014-12-26 NOTE — Progress Notes (Signed)
Pre visit review using our clinic review tool, if applicable. No additional management support is needed unless otherwise documented below in the visit note. 

## 2014-12-31 ENCOUNTER — Other Ambulatory Visit: Payer: Self-pay | Admitting: Internal Medicine

## 2015-01-01 MED ORDER — GLIPIZIDE 5 MG PO TABS: 5.0000 mg | ORAL_TABLET | Freq: Two times a day (BID) | ORAL | Status: DC

## 2015-01-01 MED ORDER — LISINOPRIL 5 MG PO TABS: 5.0000 mg | ORAL_TABLET | Freq: Every day | ORAL | Status: DC

## 2015-02-25 ENCOUNTER — Other Ambulatory Visit: Payer: Self-pay | Admitting: Internal Medicine

## 2015-03-30 ENCOUNTER — Ambulatory Visit: Payer: BLUE CROSS/BLUE SHIELD | Admitting: Internal Medicine

## 2015-04-02 ENCOUNTER — Ambulatory Visit (INDEPENDENT_AMBULATORY_CARE_PROVIDER_SITE_OTHER): Payer: BLUE CROSS/BLUE SHIELD | Admitting: Internal Medicine

## 2015-04-02 ENCOUNTER — Encounter: Payer: Self-pay | Admitting: Internal Medicine

## 2015-04-02 VITALS — BP 118/80 | HR 74 | Temp 98.7°F | Wt 173.0 lb

## 2015-04-02 DIAGNOSIS — R809 Proteinuria, unspecified: Secondary | ICD-10-CM | POA: Diagnosis not present

## 2015-04-02 DIAGNOSIS — E119 Type 2 diabetes mellitus without complications: Secondary | ICD-10-CM | POA: Diagnosis not present

## 2015-04-02 DIAGNOSIS — R369 Urethral discharge, unspecified: Secondary | ICD-10-CM

## 2015-04-02 DIAGNOSIS — E781 Pure hyperglyceridemia: Secondary | ICD-10-CM | POA: Diagnosis not present

## 2015-04-02 LAB — COMPREHENSIVE METABOLIC PANEL
ALT: 14 U/L (ref 0–53)
AST: 16 U/L (ref 0–37)
Albumin: 4.2 g/dL (ref 3.5–5.2)
Alkaline Phosphatase: 31 U/L — ABNORMAL LOW (ref 39–117)
BUN: 9 mg/dL (ref 6–23)
CO2: 26 mEq/L (ref 19–32)
Calcium: 9.3 mg/dL (ref 8.4–10.5)
Chloride: 105 mEq/L (ref 96–112)
Creatinine, Ser: 0.94 mg/dL (ref 0.40–1.50)
GFR: 111.05 mL/min (ref >60.00)
Glucose, Bld: 135 mg/dL — ABNORMAL HIGH (ref 70–99)
Potassium: 3.5 mEq/L (ref 3.5–5.1)
Sodium: 139 mEq/L (ref 135–145)
Total Bilirubin: 1.5 mg/dL — ABNORMAL HIGH (ref 0.2–1.2)
Total Protein: 7 g/dL (ref 6.0–8.3)

## 2015-04-02 LAB — CBC
HCT: 43.6 % (ref 39.0–52.0)
Hemoglobin: 14.6 g/dL (ref 13.0–17.0)
MCHC: 33.5 g/dL (ref 30.0–36.0)
MCV: 94.4 fl (ref 78.0–100.0)
Platelets: 202 10*3/uL (ref 150.0–400.0)
RBC: 4.62 Mil/uL (ref 4.22–5.81)
RDW: 12.7 % (ref 11.5–15.5)
WBC: 6.3 10*3/uL (ref 4.0–10.5)

## 2015-04-02 LAB — LIPID PANEL
Cholesterol: 133 mg/dL (ref 0–200)
HDL: 50 mg/dL (ref >39.00)
LDL Cholesterol: 72 mg/dL (ref 0–99)
NonHDL: 83
Total CHOL/HDL Ratio: 3
Triglycerides: 57 mg/dL (ref 0.0–149.0)
VLDL: 11.4 mg/dL (ref 0.0–40.0)

## 2015-04-02 LAB — HEMOGLOBIN A1C: Hgb A1c MFr Bld: 5.5 % (ref 4.6–6.5)

## 2015-04-02 NOTE — Assessment & Plan Note (Signed)
LDL at goal Continue fish oil once daily Will repeat lipid profile today

## 2015-04-02 NOTE — Addendum Note (Signed)
Addended by: Baldomero LamyHAVERS, NATASHA C on: 04/02/2015 04:05 PM   Modules accepted: Kipp BroodSmartSet

## 2015-04-02 NOTE — Assessment & Plan Note (Signed)
Will check A1C today On Lisinopril for renal protection Will continue Metformin and Glipizide today- will adjust as needed Advised him to make an appt for eye exam Foot exam today He declines Flu, Pneumovax and Prevnar today Encouraged him to consume a low carb diet

## 2015-04-02 NOTE — Progress Notes (Signed)
Pre visit review using our clinic review tool, if applicable. No additional management support is needed unless otherwise documented below in the visit note. 

## 2015-04-02 NOTE — Patient Instructions (Signed)

## 2015-04-02 NOTE — Progress Notes (Signed)
Subjective:    Patient ID: Gordon Sexton, male    DOB: 09/20/1970, 44 y.o.   MRN: [id number]  HPI  Pt presents to the clinic today for 3 month follow up of chronic conditions.  Dm 2: His last A1C was 9.1% He was started on Glipizide in addition to his Metformin. He reports that he has taken them as directed. His sugars are ranging 120-174. His last eye exam > 1 year ago and he has noticed changes in his vision. He denies numbness or tingling in his hands and feet. He tries to consume a low carb diet but could be better at it. He declines flu and pneumonia vaccines. He was also started on Lisinopril for renal protection because he had a positive microalbuminuria.  HLD: His last LDL was 80. Triglycerides are 125 taking Fish Oil once daily. He is not on statin therapy. He does try to consume a low fat diet.  He is also concerned about penile discharge. He noticed it this morning. He reports the discharge is grey. He has had some associated burning with urination. He denies pain in his testicles. He is sexually active with 1 partner and reports he does not use protection.  Review of Systems      Past Medical History  Diagnosis Date  . Diabetes mellitus without complication     Current Outpatient Prescriptions  Medication Sig Dispense Refill  . glipiZIDE (GLUCOTROL) 5 MG tablet Take 1 tablet (5 mg total) by mouth 2 (two) times daily before a meal. 60 tablet 3  . lisinopril (PRINIVIL,ZESTRIL) 5 MG tablet Take 1 tablet (5 mg total) by mouth daily. 30 tablet 3  . metFORMIN (GLUCOPHAGE) 1000 MG tablet Take 1 tablet (1,000 mg total) by mouth 2 (two) times daily with a meal. 60 tablet 3  . Omega-3 Fatty Acids (FISH OIL) 1000 MG CAPS Take 1 capsule by mouth 2 (two) times daily.     No current facility-administered medications for this visit.    No Known Allergies  Family History  Problem Relation Age of Onset  . Diabetes Sister   . Stroke Maternal Grandmother   . Cancer Neg Hx   .  Hyperlipidemia Neg Hx   . Heart disease Neg Hx     History   Social History  . Marital Status: Single    Spouse Name: N/A  . Number of Children: N/A  . Years of Education: N/A   Occupational History  . Not on file.   Social History Main Topics  . Smoking status: Never Smoker   . Smokeless tobacco: Never Used  . Alcohol Use: 0.0 oz/week    0 Standard drinks or equivalent per week     Comment: occasional  . Drug Use: No  . Sexual Activity: Yes    Birth Control/ Protection: None   Other Topics Concern  . Not on file   Social History Narrative     Constitutional: Denies fever, malaise, fatigue, headache or abrupt weight changes.  HEENT: Pt reports vision changes. Denies eye pain, eye redness, ear pain, ringing in the ears, wax buildup, runny nose, nasal congestion, bloody nose, or sore throat. Respiratory: Denies difficulty breathing, shortness of breath, cough or sputum production.   Cardiovascular: Denies chest pain, chest tightness, palpitations or swelling in the hands or feet.  Gastrointestinal: Denies abdominal pain, bloating, constipation, diarrhea or blood in the stool.  GU: Pt reports penile discharge and burning sensation with urination. Denies urgency, frequency, pain with urination, blood in  urine, odor. Musculoskeletal: Denies decrease in range of motion, difficulty with gait, muscle pain or joint pain and swelling.  Skin: Denies redness, rashes, lesions or ulcercations.  Neurological: Denies dizziness, difficulty with memory, difficulty with speech or problems with balance and coordination.  Psych: Denies anxiety, depression, SI/HI.  No other specific complaints in a complete review of systems (except as listed in HPI above).  Objective:   Physical Exam   BP 118/80 mmHg  Pulse 74  Temp(Src) 98.7 F (37.1 C) (Oral)  Wt 173 lb (78.472 kg)  SpO2 98% Wt Readings from Last 3 Encounters:  04/02/15 173 lb (78.472 kg)  12/26/14 174 lb (78.926 kg)  09/26/14  178 lb (80.74 kg)    General: Appears his stated age, well developed, well nourished in NAD. Skin: Warm, dry and intact. No rashes, lesions or ulcerations noted. HEENT: Head: normal shape and size; Eyes: sclera white, no icterus, conjunctiva pink, PERRLA and EOMs intact;  Neck:  Neck supple, trachea midline. No masses, lumps or thyromegaly present.  Cardiovascular: Normal rate and rhythm. S1,S2 noted.  No murmur, rubs or gallops noted. No carotid bruits noted. Pulmonary/Chest: Normal effort and positive vesicular breath sounds. No respiratory distress. No wheezes, rales or ronchi noted.  Abdomen: Soft and nontender. Normal bowel sounds, no bruits noted. No distention or masses noted. Liver, spleen and kidneys non palpable. GU: Normal male anatomy. No penile discharge note. No pain/abnormality noted with palpation of the testicles.  Neurological: Alert and oriented. Sensation intact to BLE. Psychiatric: Mood and affect normal. Behavior is normal. Judgment and thought content normal.   EKG:  BMET    Component Value Date/Time   NA 130* 09/26/2014 1025   K 3.7 09/26/2014 1025   CL 99 09/26/2014 1025   CO2 27 09/26/2014 1025   GLUCOSE 451* 09/26/2014 1025   BUN 11 09/26/2014 1025   CREATININE 1.1 09/26/2014 1025   CALCIUM 9.0 09/26/2014 1025    Lipid Panel     Component Value Date/Time   CHOL 155 12/26/2014 1042   TRIG 125.0 12/26/2014 1042   HDL 49.90 12/26/2014 1042   CHOLHDL 3 12/26/2014 1042   VLDL 25.0 12/26/2014 1042   LDLCALC 80 12/26/2014 1042    CBC    Component Value Date/Time   WBC 5.9 09/26/2014 1025   RBC 5.01 09/26/2014 1025   HGB 15.7 09/26/2014 1025   HCT 47.3 09/26/2014 1025   PLT 179.0 09/26/2014 1025   MCV 94.5 09/26/2014 1025   MCHC 33.2 09/26/2014 1025   RDW 12.7 09/26/2014 1025    Hgb A1C Lab Results  Component Value Date   HGBA1C 9.1* 12/26/2014        Assessment & Plan:   Penile discharge:  Will check urine gc probe Discussed safe  sexual practices He declines testing for HIV and RPR today  RTC in 3 months to follow up chronic conditons

## 2015-04-03 LAB — GC/CHLAMYDIA PROBE AMP, URINE
Chlamydia, Swab/Urine, PCR: NEGATIVE
GC Probe Amp, Urine: NEGATIVE

## 2015-04-04 ENCOUNTER — Other Ambulatory Visit: Payer: Self-pay | Admitting: Internal Medicine

## 2015-05-13 ENCOUNTER — Other Ambulatory Visit: Payer: Self-pay | Admitting: Internal Medicine

## 2015-08-25 ENCOUNTER — Other Ambulatory Visit: Payer: Self-pay | Admitting: Internal Medicine

## 2015-10-21 ENCOUNTER — Other Ambulatory Visit: Payer: Self-pay | Admitting: Internal Medicine

## 2016-01-08 ENCOUNTER — Ambulatory Visit (INDEPENDENT_AMBULATORY_CARE_PROVIDER_SITE_OTHER): Payer: BLUE CROSS/BLUE SHIELD | Admitting: Internal Medicine

## 2016-01-08 ENCOUNTER — Encounter: Payer: Self-pay | Admitting: Internal Medicine

## 2016-01-08 VITALS — BP 118/80 | HR 73 | Temp 98.2°F | Wt 182.0 lb

## 2016-01-08 DIAGNOSIS — E1165 Type 2 diabetes mellitus with hyperglycemia: Secondary | ICD-10-CM | POA: Diagnosis not present

## 2016-01-08 DIAGNOSIS — E781 Pure hyperglyceridemia: Secondary | ICD-10-CM

## 2016-01-08 LAB — COMPREHENSIVE METABOLIC PANEL
ALK PHOS: 35 U/L — AB (ref 39–117)
ALT: 43 U/L (ref 0–53)
AST: 45 U/L — ABNORMAL HIGH (ref 0–37)
Albumin: 4.1 g/dL (ref 3.5–5.2)
BILIRUBIN TOTAL: 1.1 mg/dL (ref 0.2–1.2)
BUN: 10 mg/dL (ref 6–23)
CALCIUM: 9.6 mg/dL (ref 8.4–10.5)
CO2: 27 mEq/L (ref 19–32)
Chloride: 103 mEq/L (ref 96–112)
Creatinine, Ser: 1.04 mg/dL (ref 0.40–1.50)
GFR: 98.49 mL/min (ref >60.00)
GLUCOSE: 164 mg/dL — AB (ref 70–99)
POTASSIUM: 3.6 meq/L (ref 3.5–5.1)
Sodium: 137 mEq/L (ref 135–145)
TOTAL PROTEIN: 7 g/dL (ref 6.0–8.3)

## 2016-01-08 LAB — LIPID PANEL
CHOLESTEROL: 144 mg/dL (ref 0–200)
HDL: 41.9 mg/dL (ref >39.00)
LDL Cholesterol: 74 mg/dL (ref 0–99)
NonHDL: 101.92
TRIGLYCERIDES: 140 mg/dL (ref 0.0–149.0)
Total CHOL/HDL Ratio: 3
VLDL: 28 mg/dL (ref 0.0–40.0)

## 2016-01-08 LAB — CBC
HCT: 40.5 % (ref 39.0–52.0)
Hemoglobin: 13.6 g/dL (ref 13.0–17.0)
MCHC: 33.7 g/dL (ref 30.0–36.0)
MCV: 93.4 fl (ref 78.0–100.0)
Platelets: 199 10*3/uL (ref 150.0–400.0)
RBC: 4.34 Mil/uL (ref 4.22–5.81)
RDW: 13 % (ref 11.5–15.5)
WBC: 5.7 10*3/uL (ref 4.0–10.5)

## 2016-01-08 LAB — HEMOGLOBIN A1C: Hgb A1c MFr Bld: 7 % — ABNORMAL HIGH (ref 4.6–6.5)

## 2016-01-08 NOTE — Progress Notes (Signed)
Subjective:    Patient ID: Gordon Sexton, male    DOB: 10/28/1970, 45 y.o.   MRN: [id number]  HPI  Pt presents to the clinic today for follow up of chronic conditions.  DM 2: He stopped taking his Metformin last fall, but went to UC 1 month ago and was told he had sugar in his urine. Pt resumed the Metformin taking 1000 mg daily (he's prescribed 2000 mg daily). He has been taking the Glipizide as prescribed. He occasionally takes Lisinopril, because he was concerned that it would not help him.  His Last A1C (03/2015) was 5.5%.  Diet: Occasional fruits and vegetables. Occasional fried foods. Drinks some water but mostly diet sodas.  Exercise: 5 days a wk-- weight lifting. Walks a lot for his job.   Eye exam: hasn't seen a eye doctor in several years. Noticed recent decrease in distance vision. He does not take flu or pneumonia vaccines.  HLD: His last LDL was 72, Triglycerides 57. He is taking Fish Oil daily as prescribed.  Review of Systems   Past Medical History  Diagnosis Date  . Diabetes mellitus without complication (HCC)     Current Outpatient Prescriptions  Medication Sig Dispense Refill  . glipiZIDE (GLUCOTROL) 5 MG tablet TAKE 1 TABLET BY MOUTH TWICE A DAY BEFORE MEALS (MUST SCHEDULE ANNUAL PHYSICAL BEFORE REFILLING) 60 tablet 1  . lisinopril (PRINIVIL,ZESTRIL) 5 MG tablet TAKE 1 TABLET (5 MG TOTAL) BY MOUTH DAILY. MUST SCHEDULE ANNUAL PHYSICAL FOR MORE REFILLS 30 tablet 1  . metFORMIN (GLUCOPHAGE) 1000 MG tablet TAKE 1 TABLET (1,000 MG TOTAL) BY MOUTH 2 (TWO) TIMES DAILY WITH A MEAL. 60 tablet 2  . Omega-3 Fatty Acids (FISH OIL) 1000 MG CAPS Take 1 capsule by mouth 2 (two) times daily.     No current facility-administered medications for this visit.    No Known Allergies  Family History  Problem Relation Age of Onset  . Diabetes Sister   . Stroke Maternal Grandmother   . Cancer Neg Hx   . Hyperlipidemia Neg Hx   . Heart disease Neg Hx     Social History    Social History  . Marital Status: Single    Spouse Name: N/A  . Number of Children: N/A  . Years of Education: N/A   Occupational History  . Not on file.   Social History Main Topics  . Smoking status: Never Smoker   . Smokeless tobacco: Never Used  . Alcohol Use: 0.0 oz/week    0 Standard drinks or equivalent per week     Comment: occasional  . Drug Use: No  . Sexual Activity: Yes    Birth Control/ Protection: None   Other Topics Concern  . Not on file   Social History Narrative     Constitutional: Denies fever, fatigue, headache or abrupt weight changes.  HEENT: Reports recent decrease in distance vision. Denies blurry vision or eye pain.  Respiratory: Denies difficulty breathing, shortness of breath, cough or sputum production.   Cardiovascular: Denies chest pain, chest tightness, palpitations or swelling in the hands or feet.  Gastrointestinal: Denies abdominal pain, bloating, constipation, diarrhea or blood in the stool.  GU: Denies urgency, frequency, pain with urination, burning sensation, blood in urine, odor or discharge. Skin: Denies redness, rashes, lesions or ulcercations.   No other specific complaints in a complete review of systems (except as listed in HPI above).     Objective:   Physical Exam  BP 118/80 mmHg  Pulse 73  Temp(Src) 98.2 F (36.8 C) (Oral)  Wt 182 lb (82.555 kg)  SpO2 97%  Wt Readings from Last 3 Encounters:  01/08/16 182 lb (82.555 kg)  04/02/15 173 lb (78.472 kg)  12/26/14 174 lb (78.926 kg)    General: Appears his stated age, well developed, well nourished in NAD. Pt has gained 10 lbs in the last year.  Skin: Warm, dry and intact. No rashes, lesions or ulcerations noted. HEENT: Head: normal shape and size; Eyes: sclera white, no icterus, conjunctiva pink; Throat/Mouth: Teeth present, mucosa pink and moist, no exudate, lesions or ulcerations noted.  Cardiovascular: Normal rate and rhythm. S1,S2 noted.  No murmur, rubs or  gallops noted. No JVD or BLE edema.  Pulmonary/Chest: Normal effort and positive vesicular breath sounds. No respiratory distress. No wheezes, rales or ronchi noted.    BMET    Component Value Date/Time   NA 139 04/02/2015 1100   K 3.5 04/02/2015 1100   CL 105 04/02/2015 1100   CO2 26 04/02/2015 1100   GLUCOSE 135* 04/02/2015 1100   BUN 9 04/02/2015 1100   CREATININE 0.94 04/02/2015 1100   CALCIUM 9.3 04/02/2015 1100    Lipid Panel     Component Value Date/Time   CHOL 133 04/02/2015 1100   TRIG 57.0 04/02/2015 1100   HDL 50.00 04/02/2015 1100   CHOLHDL 3 04/02/2015 1100   VLDL 11.4 04/02/2015 1100   LDLCALC 72 04/02/2015 1100    CBC    Component Value Date/Time   WBC 6.3 04/02/2015 1100   RBC 4.62 04/02/2015 1100   HGB 14.6 04/02/2015 1100   HCT 43.6 04/02/2015 1100   PLT 202.0 04/02/2015 1100   MCV 94.4 04/02/2015 1100   MCHC 33.5 04/02/2015 1100   RDW 12.7 04/02/2015 1100    Hgb A1C Lab Results  Component Value Date   HGBA1C 5.5 04/02/2015         Assessment & Plan:   Diabetes Type 2:  Discussed importance of taking medications as prescribed Check A1C, CBC, CMET Continue Lisinopril for renal protection Discussed diet Will reassess meds after labs are back  HLD:  Discussed eating a low fat diet Check lipid profile and CMET today  RTC in 6 months for annual exam

## 2016-01-08 NOTE — Patient Instructions (Signed)
How to Avoid Diabetes Problems  You can do a lot to prevent or slow down diabetes problems. Following your diabetes plan and taking care of yourself can reduce your risk of serious or life-threatening complications. Below, you will find certain things you can do to prevent diabetes problems.  MANAGE YOUR DIABETES  Follow your health care provider's, nurse educator's, and dietitian's instructions for managing your diabetes. They will teach you the basics of diabetes care. They can help answer questions you may have. Learn about diabetes and make healthy choices regarding eating and physical activity. Monitor your blood glucose level regularly. Your health care provider will help you decide how often to check your blood glucose level depending on your treatment goals and how well you are meeting them.   DO NOT USE NICOTINE  Nicotine and diabetes are a dangerous combination. Nicotine raises your risk for diabetes problems. If you quit using nicotine, you will lower your risk for heart attack, stroke, nerve disease, and kidney disease. Your cholesterol and your blood pressure levels may improve. Your blood circulation will also improve. Do not use any tobacco products, including cigarettes, chewing tobacco, or electronic cigarettes. If you need help quitting, ask your health care provider.  KEEP YOUR BLOOD PRESSURE UNDER CONTROL  Your health care provider will determine your individualized target blood pressure based on your age, your medicines, how long you have had diabetes, and any other medical conditions you have. Blood pressure consists of two numbers. Generally, the goal is to keep your top number (systolic pressure) at or below 130, and your bottom number (diastolic pressure) at or below 80. Your health care provider may recommend a lower target blood pressure reading, if appropriate. Meal planning, medicines, and exercise can help you reach your target blood pressure. Make sure your health care provider checks  your blood pressure at every visit.  KEEP YOUR CHOLESTEROL UNDER CONTROL  Normal cholesterol levels will help prevent heart disease and stroke. These are the biggest health problems for people with diabetes. Keeping cholesterol levels under control can also help with blood flow. Have your cholesterol level checked at least once a year. Your health care provider may prescribe a medicine known as a statin. Statins lower your cholesterol. If you are not taking a statin, ask your health care provider if you should be. Meal planning, exercise, and medicines can help you reach your cholesterol targets.   SCHEDULE AND KEEP YOUR ANNUAL PHYSICAL EXAMS AND EYE EXAMS  Your health care provider will tell you how often he or she wants to see you depending on your plan of treatment. It is important that you keep these appointments so that possible problems can be identified early and complications can be avoided or treated.  · Every visit with your health care provider should include your weight, blood pressure, and an evaluation of your blood glucose control.  · Your hemoglobin A1c should be checked:    At least twice a year if you are at your goal.    Every 3 months if there are changes in treatment.    If you are not meeting your goals.  · Your blood lipids should be checked yearly. You should also be checked yearly to see if you have protein in your urine (microalbumin).  · Schedule a dilated eye exam within 5 years of your diagnosis if you have type 1 diabetes, and then yearly. Schedule a dilated eye exam at diagnosis if you have type 2 diabetes, and then yearly. All   exams thereafter can be extended to every 2 to 3 years if one or more exams have been normal.  KEEP YOUR VACCINES CURRENT  It is recommended that you receive a flu (influenza) vaccine every year. It is also recommended that you receive a pneumonia (pneumococcal) vaccine. If you are 65 years of age or older and have never received a pneumonia vaccine, this  vaccine may be given as a series of two separate shots. Ask your health care provider which additional vaccines may be recommended.  TAKE CARE OF YOUR FEET   Diabetes may cause you to have a poor blood supply (circulation) to your legs and feet. Because of this, the skin may be thinner, break easier, and heal more slowly. You also may have nerve damage in your legs and feet, causing decreased feeling. You may not notice minor injuries to your feet that could lead to serious problems or infections. Taking care of your feet is very important.  Visual foot exams are performed at every routine medical visit. The exams check for cuts, injuries, or other problems with the feet. A comprehensive foot exam should be done yearly. This includes visual inspection as well as assessing foot pulses and testing for loss of sensation. You should also do the following:  · Inspect your feet daily for cuts, calluses, blisters, ingrown toenails, and signs of infection, such as redness, swelling, or pus.  · Wash and dry your feet thoroughly, especially between the toes.  · Avoid soaking your feet regularly in hot water baths.  · Moisturize dry skin with lotion, avoiding areas between your toes.  · Cut toenails straight across and file the edges.  · Avoid shoes that do not fit well or have areas that irritate your skin.  · Avoid going barefooted or wearing only socks. Your feet need protection.  TAKE CARE OF YOUR TEETH  People with poorly controlled diabetes are more likely to have gum (periodontal) disease. These infections make diabetes harder to control. Periodontal diseases, if left untreated, can lead to tooth loss. Brush your teeth twice a day, floss, and see your dentist for checkups and cleaning every 6 months, or 2 times a year.  ASK YOUR HEALTH CARE PROVIDER ABOUT TAKING ASPIRIN  Taking aspirin daily is recommended to help prevent cardiovascular disease in people with and without diabetes. Ask your health care provider if this  would benefit you and what dose he or she would recommend.  DRINK RESPONSIBLY  Moderate amounts of alcohol (less than 1 drink per day for adult women and less than 2 drinks per day for adult men) have a minimal effect on blood glucose if ingested with food. It is important to eat food with alcohol to avoid hypoglycemia. People should avoid alcohol if they have a history of alcohol abuse or dependence, if they are pregnant, and if they have liver disease, pancreatitis, advanced neuropathy, or severe hypertriglyceridemia.  LESSEN STRESS  Living with diabetes can be stressful. When you are under stress, your blood glucose may be affected in two ways:  · Stress hormones may cause your blood glucose to rise.  · You may be distracted from taking good care of yourself.  It is a good idea to be aware of your stress level and make changes that are necessary to help you better manage challenging situations. Support groups, planned relaxation, a hobby you enjoy, meditation, healthy relationships, and exercise all work to lower your stress level. If your efforts do not seem to be helping,   get help from your health care provider or a trained mental health professional.     This information is not intended to replace advice given to you by your health care provider. Make sure you discuss any questions you have with your health care provider.     Document Released: 05/20/2011 Document Revised: 09/22/2014 Document Reviewed: 10/26/2013  Elsevier Interactive Patient Education ©2016 [employer]

## 2016-01-08 NOTE — Progress Notes (Signed)
Pre visit review using our clinic review tool, if applicable. No additional management support is needed unless otherwise documented below in the visit note. 

## 2016-01-09 ENCOUNTER — Other Ambulatory Visit: Payer: Self-pay | Admitting: Internal Medicine

## 2016-01-09 MED ORDER — METFORMIN HCL 1000 MG PO TABS: ORAL_TABLET | ORAL | Status: DC

## 2016-01-09 MED ORDER — LISINOPRIL 5 MG PO TABS: ORAL_TABLET | ORAL | Status: DC

## 2016-01-09 MED ORDER — GLIPIZIDE 5 MG PO TABS: ORAL_TABLET | ORAL | Status: DC

## 2016-03-13 ENCOUNTER — Emergency Department: Payer: BLUE CROSS/BLUE SHIELD

## 2016-03-13 ENCOUNTER — Emergency Department
Admission: EM | Admit: 2016-03-13 | Discharge: 2016-03-13 | Disposition: A | Discharge status: Home / Self Care | Payer: BLUE CROSS/BLUE SHIELD | Attending: Emergency Medicine | Admitting: Emergency Medicine

## 2016-03-13 DIAGNOSIS — R0789 Other chest pain: Secondary | ICD-10-CM | POA: Diagnosis present

## 2016-03-13 DIAGNOSIS — E1165 Type 2 diabetes mellitus with hyperglycemia: Secondary | ICD-10-CM | POA: Insufficient documentation

## 2016-03-13 DIAGNOSIS — I10 Essential (primary) hypertension: Secondary | ICD-10-CM | POA: Insufficient documentation

## 2016-03-13 DIAGNOSIS — K219 Gastro-esophageal reflux disease without esophagitis: Secondary | ICD-10-CM | POA: Diagnosis not present

## 2016-03-13 DIAGNOSIS — Z7984 Long term (current) use of oral hypoglycemic drugs: Secondary | ICD-10-CM | POA: Diagnosis not present

## 2016-03-13 DIAGNOSIS — Z79899 Other long term (current) drug therapy: Secondary | ICD-10-CM | POA: Insufficient documentation

## 2016-03-13 HISTORY — DX: Essential (primary) hypertension: I10

## 2016-03-13 LAB — BASIC METABOLIC PANEL
Anion gap: 7 (ref 5–15)
BUN: 9 mg/dL (ref 6–20)
CALCIUM: 9.1 mg/dL (ref 8.9–10.3)
CO2: 24 mmol/L (ref 22–32)
CREATININE: 0.98 mg/dL (ref 0.61–1.24)
Chloride: 106 mmol/L (ref 101–111)
GFR calc Af Amer: >60 mL/min (ref >60)
GFR calc non Af Amer: >60 mL/min (ref >60)
GLUCOSE: 174 mg/dL — AB (ref 65–99)
Potassium: 3.6 mmol/L (ref 3.5–5.1)
Sodium: 137 mmol/L (ref 135–145)

## 2016-03-13 LAB — CBC
HCT: 43.7 % (ref 40.0–52.0)
HEMOGLOBIN: 14.9 g/dL (ref 13.0–18.0)
MCH: 31 pg (ref 26.0–34.0)
MCHC: 34.1 g/dL (ref 32.0–36.0)
MCV: 91.1 fL (ref 80.0–100.0)
PLATELETS: 159 10*3/uL (ref 150–440)
RBC: 4.79 MIL/uL (ref 4.40–5.90)
RDW: 12.7 % (ref 11.5–14.5)
WBC: 4.9 10*3/uL (ref 3.8–10.6)

## 2016-03-13 LAB — TROPONIN I

## 2016-03-13 MED ORDER — ALUMINUM-MAGNESIUM-SIMETHICONE 200-200-20 MG/5ML PO SUSP: 30.0000 mL | Freq: Three times a day (TID) | ORAL | Status: DC

## 2016-03-13 MED ORDER — FAMOTIDINE 20 MG PO TABS: 20.0000 mg | ORAL_TABLET | Freq: Two times a day (BID) | ORAL | Status: DC

## 2016-03-13 MED ORDER — FAMOTIDINE 20 MG PO TABS
40.0000 mg | ORAL_TABLET | Freq: Once | ORAL | Status: AC
  Administered 2016-03-13: 40 mg via ORAL
  Filled 2016-03-13: qty 2

## 2016-03-13 MED ORDER — GI COCKTAIL ~~LOC~~
30.0000 mL | ORAL | Status: AC
  Administered 2016-03-13: 30 mL via ORAL
  Filled 2016-03-13: qty 30

## 2016-03-13 NOTE — ED Notes (Signed)
Discharge instructions and paperwork reviewed with patient. Pt verbalized understanding and consent for discharge. PT in NAD at time of D/C and able to ambulate independently.

## 2016-03-13 NOTE — ED Provider Notes (Signed)
[facility] Emergency Department Provider Note  ____________________________________________  Time seen: 11:40 AM  I have reviewed the triage vital signs and the nursing notes.   HISTORY  Chief Complaint Chest Pain and Nausea    HPI Gordon Sexton is a 45 y.o. male who complains of intermittent chest pain for the last 3 days. It lasts for about 2 hours when it comes on. Nonradiating, associated with nausea but no vomiting diaphoresis or shortness of breath. No dizziness or syncope. He usually takes Zantac everyday, but he stopped taking it about a week ago. No exertional symptoms. He works out multiple times a week without any symptoms. Nonpleuritic.  He does have diabetes, managed by his primary care doctor. He is succeeding with lifestyle modification. He stopped smoking 10 years ago.  Pain is worse with lying supine.   Past Medical History  Diagnosis Date  . Diabetes mellitus without complication (HCC)   . Hypertension      Patient Active Problem List   Diagnosis Date Noted  . Hypertriglyceridemia 12/26/2014  . Type 2 diabetes mellitus with hyperglycemia (HCC) 09/26/2014     History reviewed. No pertinent past surgical history.   Current Outpatient Rx  Name  Route  Sig  Dispense  Refill  . aluminum-magnesium hydroxide-simethicone (MAALOX) 200-200-20 MG/5ML SUSP   Oral   Take 30 mLs by mouth 4 (four) times daily -  before meals and at bedtime.   355 mL   0   . famotidine (PEPCID) 20 MG tablet   Oral   Take 1 tablet (20 mg total) by mouth 2 (two) times daily.   60 tablet   0   . glipiZIDE (GLUCOTROL) 5 MG tablet      TAKE 1 TABLET BY MOUTH TWICE A DAY BEFORE MEALS (MUST SCHEDULE ANNUAL PHYSICAL BEFORE REFILLING)   60 tablet   5     Last refill without office visit   . lisinopril (PRINIVIL,ZESTRIL) 5 MG tablet      TAKE 1 TABLET (5 MG TOTAL) BY MOUTH DAILY. MUST SCHEDULE ANNUAL PHYSICAL FOR MORE REFILLS   30 tablet   5    Last refill without office visit   . metFORMIN (GLUCOPHAGE) 1000 MG tablet      TAKE 1 TABLET (1,000 MG TOTAL) BY MOUTH 2 (TWO) TIMES DAILY WITH A MEAL.   60 tablet   5   . Omega-3 Fatty Acids (FISH OIL) 1000 MG CAPS   Oral   Take 1 capsule by mouth 2 (two) times daily.            Allergies Review of patient's allergies indicates no known allergies.   Family History  Problem Relation Age of Onset  . Diabetes Sister   . Stroke Maternal Grandmother   . Cancer Neg Hx   . Hyperlipidemia Neg Hx   . Heart disease Neg Hx     Social History Social History  Substance Use Topics  . Smoking status: Never Smoker   . Smokeless tobacco: Never Used  . Alcohol Use: 0.0 oz/week    0 Standard drinks or equivalent per week     Comment: occasional    Review of Systems  Constitutional:   No fever or chills.  ENT:   No sore throat. No rhinorrhea. Cardiovascular:   Positive as above chest pain. Respiratory:   No dyspnea or cough. Gastrointestinal:   Negative for abdominal pain, vomiting and diarrhea.  Neurological:   Negative for headaches 10-point ROS otherwise negative.  ____________________________________________  PHYSICAL EXAM:  VITAL SIGNS: ED Triage Vitals  Enc Vitals Group     BP 03/13/16 1042 140/90 mmHg     Pulse Rate 03/13/16 1042 84     Resp 03/13/16 1141 16     Temp 03/13/16 1042 98.4 F (36.9 C)     Temp Source 03/13/16 1042 Oral     SpO2 03/13/16 1042 95 %     Weight 03/13/16 1042 180 lb (81.647 kg)     Height 03/13/16 1042 5\' 6"  (1.676 m)     Head Cir --      Peak Flow --      Pain Score 03/13/16 1035 6     Pain Loc --      Pain Edu? --      Excl. in GC? --     Vital signs reviewed, nursing assessments reviewed.   Constitutional:   Alert and oriented. Well appearing and in no distress. Eyes:   No scleral icterus. No conjunctival pallor. PERRL. EOMI.  No nystagmus. ENT   Head:   Normocephalic and atraumatic.   Nose:   No  congestion/rhinnorhea. No septal hematoma   Mouth/Throat:   MMM, Mild pharyngeal erythema. No peritonsillar mass.    Neck:   No stridor. No SubQ emphysema. No meningismus. Hematological/Lymphatic/Immunilogical:   No cervical lymphadenopathy. Cardiovascular:   RRR. Symmetric bilateral radial and DP pulses.  No murmurs.  Respiratory:   Normal respiratory effort without tachypnea nor retractions. Breath sounds are clear and equal bilaterally. No wheezes/rales/rhonchi. Gastrointestinal:   Soft and nontender. Non distended. There is no CVA tenderness.  No rebound, rigidity, or guarding. Genitourinary:   deferred Musculoskeletal:   Nontender with normal range of motion in all extremities. No joint effusions.  No lower extremity tenderness.  No edema. Neurologic:   Normal speech and language.  CN 2-10 normal. Motor grossly intact. No gross focal neurologic deficits are appreciated.  Skin:    Skin is warm, dry and intact. No rash noted.  No petechiae, purpura, or bullae.  ____________________________________________    LABS (pertinent positives/negatives) (all labs ordered are listed, but only abnormal results are displayed) Labs Reviewed  BASIC METABOLIC PANEL - Abnormal; Notable for the following:    Glucose, Bld 174 (*)    All other components within normal limits  CBC  TROPONIN I   ____________________________________________   EKG Interpreted by me  Date: 03/13/2016  Rate: 82  Rhythm: normal sinus rhythm  QRS Axis: normal  Intervals: normal  ST/T Wave abnormalities: normal  Conduction Disutrbances: none  Narrative Interpretation: unremarkable      ____________________________________________    RADIOLOGY   Chest x-ray unremarkable ____________________________________________   PROCEDURES   ____________________________________________   INITIAL IMPRESSION / ASSESSMENT AND PLAN / ED COURSE  Pertinent labs & imaging results that were available during my  care of the patient were reviewed by me and considered in my medical decision making (see chart for details).  Patient presents with atypical chest pain. Clinically is consistent with GERD. He is very well appearing without any abnormalities on vital signs labs chest x-ray or EKG. Exam is reassuring.Considering the patient's symptoms, medical history, and physical examination today, I have low suspicion for ACS, PE, TAD, pneumothorax, carditis, mediastinitis, pneumonia, CHF, or sepsis.   Maalox and Pepcid, follow-up with primary care     ____________________________________________   FINAL CLINICAL IMPRESSION(S) / ED DIAGNOSES  Final diagnoses:  Gastroesophageal reflux disease, esophagitis presence not specified  Atypical chest pain  Portions of this note were generated with dragon dictation software. Dictation errors may occur despite best attempts at proofreading.   Sharman CheekPhillip Britini Garcilazo, MD 03/13/16 1324

## 2016-03-13 NOTE — ED Notes (Addendum)
[facility] c/o left sided intermittent CP X 3 days. States pain did radiate down left arm and has been accompanied with nausea. Pt states he usually takes Zantac daily, has been out and unsure if related. Pt alert and oriented X4, active, cooperative, pt in NAD. RR even and unlabored, color WNL.   Pt denies SOB. Pt ambulatory.

## 2016-03-13 NOTE — ED Notes (Signed)
Pt reports pain has come back in chest. Pt reports the pain had decreased after taking the GI cocktail but he states over time the pain has returned.

## 2016-03-13 NOTE — ED Notes (Signed)
Patient ambulatory to Xray.

## 2016-03-13 NOTE — Discharge Instructions (Signed)

## 2016-03-13 NOTE — ED Notes (Signed)
MD at bedside. 

## 2016-04-03 LAB — HM DIABETES EYE EXAM

## 2016-04-04 ENCOUNTER — Encounter: Payer: Self-pay | Admitting: Internal Medicine

## 2016-07-05 ENCOUNTER — Other Ambulatory Visit: Payer: Self-pay | Admitting: Internal Medicine

## 2017-04-27 NOTE — Progress Notes (Signed)
04/28/2017 12:25 PM   Gordon RippleSylvester Sexton 11/02/1970 [id number]  Referring provider: Lorre MunroeBaity, Regina W, NP [address] [place], [place] [id number]  Chief Complaint  Patient presents with  . New Patient (Initial Visit)    D[facility]uria [facility]    HPI: Patient is a 46 year old African American male who is referred by Lorre Munroeegina W Baity, NP for dysuria.    He has a history of NGU last year.  At that time, he was having penile discharge and dysuria.  He was treated with antibiotics and retested.    He states he current symptoms consist of the dysuria only and no discharge.  This has been occurring since he STI.  He denies gross hematuria, suprapubic pain, abdominal pain or flank pain.  He is having lower back pain.    He has been having to get up two to three times a night to use the restroom since 2012.  He has been having day time frequency four to five episode during the day.  He has not had any recent fevers, chills, nausea or vomiting.   He does not have a history of nephrolithiasis, GU surgery or GU trauma.   He is sexually active.  May   His IPSS score today is 7429, which is severe lower urinary tract symptomatology.  He is mostly dissatisfied with his quality life due to his urinary symptoms. His PVR is 145 mL.    His major complaints today are frequency, dysuria, nocturia, incontinence, intermittency, painful intercourse and a weak urinary stream..  He has had these symptoms for five years.  He denies any dysuria, hematuria or suprapubic pain.   He also denies any recent fevers, chills, nausea or vomiting.  He does not have a family history of PCa.      IPSS    Row Name 04/28/17 1100         International Prostate Symptom Score   How often have you had the sensation of not emptying your bladder? Almost always     How often have you had to urinate less than every two hours? More than half the time     How often have you found you  stopped and started again several times when you urinated? Almost always     How often have you found it difficult to postpone urination? Almost always     How often have you had a weak urinary stream? Almost always     How often have you had to strain to start urination? Not at All     How many times did you typically get up at night to urinate? 5 Times     Total IPSS Score 29       Quality of Life due to urinary symptoms   If you were to spend the rest of your life with your urinary condition just the way it is now how would you feel about that? Mostly Disatisfied        Score:  1-7 Mild 8-19 Moderate 20-35 Severe  Reviewed referral notes - urine culture and STI cultures were negative  PMH: Past Medical History:  Diagnosis Date  . Diabetes mellitus without complication (HCC)   . Hypertension     Surgical History: History reviewed. No pertinent surgical history.  Home Medications:  Allergies as of 04/28/2017   No Known Allergies     Medication List       Accurate as of  04/28/17 12:25 PM. Always use your most recent med list.          aluminum-magnesium hydroxide-simethicone 200-200-20 MG/5ML Susp Commonly known as:  MAALOX Take 30 mLs by mouth 4 (four) times daily -  before meals and at bedtime.   famotidine 20 MG tablet Commonly known as:  PEPCID Take 1 tablet (20 mg total) by mouth 2 (two) times daily.   finasteride 5 MG tablet Commonly known as:  PROSCAR Take 1 tablet (5 mg total) by mouth daily.   Fish Oil 1000 MG Caps Take 1 capsule by mouth 2 (two) times daily.   FISH OIL PO Take by mouth.   glipiZIDE 5 MG tablet Commonly known as:  GLUCOTROL Take 1 tablet (5 mg total) by mouth 2 (two) times daily before a meal. MUST SCHEDULE ANNUAL EXAM FOR FURTHER REFILLS   lisinopril 5 MG tablet Commonly known as:  PRINIVIL,ZESTRIL Take 1 tablet (5 mg total) by mouth daily. MUST SCHEDULE ANNUAL EXAM FOR FURTHER REFILLS   metFORMIN 1000 MG tablet Commonly  known as:  GLUCOPHAGE TAKE 1 TABLET (1,000 MG TOTAL) BY MOUTH 2 (TWO) TIMES DAILY WITH A MEAL.   ranitidine 150 MG tablet Commonly known as:  ZANTAC Take 150 mg by mouth 2 (two) times daily.   tamsulosin 0.4 MG Caps capsule Commonly known as:  FLOMAX Take 1 capsule (0.4 mg total) by mouth daily.       Allergies: No Known Allergies  Family History: Family History  Problem Relation Age of Onset  . Diabetes Sister   . Stroke Maternal Grandmother   . Cancer Neg Hx   . Hyperlipidemia Neg Hx   . Heart disease Neg Hx     Social History:  reports that he quit smoking about 15 years ago. He has never used smokeless tobacco. He reports that he drinks alcohol. He reports that he does not use drugs.  ROS: UROLOGY Frequent Urination?: Yes Hard to postpone urination?: No Burning/pain with urination?: Yes Get up at night to urinate?: Yes Leakage of urine?: Yes Urine stream starts and stops?: Yes Trouble starting stream?: No Do you have to strain to urinate?: No Blood in urine?: No Urinary tract infection?: No Sexually transmitted disease?: No Injury to kidneys or bladder?: No Painful intercourse?: Yes Weak stream?: Yes Erection problems?: Yes Penile pain?: Yes  Gastrointestinal Nausea?: No Vomiting?: No Indigestion/heartburn?: No Diarrhea?: No Constipation?: No  Constitutional Fever: No Night sweats?: No Weight loss?: No Fatigue?: No  Skin Skin rash/lesions?: No Itching?: No  Eyes Blurred vision?: Yes Double vision?: No  Ears/Nose/Throat Sore throat?: No Sinus problems?: No  Hematologic/Lymphatic Swollen glands?: No Easy bruising?: No  Cardiovascular Leg swelling?: No Chest pain?: Yes  Respiratory Cough?: No Shortness of breath?: No  Endocrine Excessive thirst?: No  Musculoskeletal Back pain?: Yes Joint pain?: No  Neurological Headaches?: No Dizziness?: Yes  Psychologic Depression?: No Anxiety?: No  Physical Exam: BP (!) 131/91    Pulse 71   Ht 5\' 6"  (1.676 m)   Wt 181 lb 4.8 oz (82.2 kg)   BMI 29.26 kg/m   Constitutional: Well nourished. Alert and oriented, No acute distress. HEENT: [place] AT, moist mucus membranes. Trachea midline, no masses. Cardiovascular: No clubbing, cyanosis, or edema. Respiratory: Normal respiratory effort, no increased work of breathing. GI: Abdomen is soft, non tender, non distended, no abdominal masses. Liver and spleen not palpable.  No hernias appreciated.  Stool sample for occult testing is not indicated.   GU: No CVA tenderness.  No bladder fullness or masses.  Patient with circumcised phallus.  Urethral meatus is patent.  No penile discharge. No penile lesions or rashes. Scrotum without lesions, cysts, rashes and/or edema.  Testicles are located scrotally bilaterally. No masses are appreciated in the testicles. Left and right epididymis are normal. Rectal: Patient with  normal sphincter tone. Anus and perineum without scarring or rashes. No rectal masses are appreciated. Prostate is approximately 55 grams, no nodules are appreciated. Seminal vesicles are normal. Skin: No rashes, bruises or suspicious lesions. Lymph: No cervical or inguinal adenopathy. Neurologic: Grossly intact, no focal deficits, moving all 4 extremities. Psychiatric: Normal mood and affect.  Laboratory Data: PSA History  1.13 ng/mL in 01/2016  Urinalysis Unremarkable.  See EPIC  I have reviewed the labs.   Pertinent Imaging: Results for ERION, WEIGHTMAN (MRN [id number]) as of 04/28/2017 12:21  Ref. Range 04/28/2017 11:41  Scan Result Unknown 145    I have independently reviewed the films.    Assessment & Plan:    1. Dysuria  - may be due to forcing urine past an enlarged prostate  - will reassess when he returns  2. BPH with LUTS  - IPSS score is 29/4   - Continue conservative management, avoiding bladder irritants and timed voiding's  - most bothersome symptoms is/are nocturia, frequency and  dysuria  - Initiate alpha-blocker (tamsulosin 0.4 mg), discussed side effects   - Initiate 5 alpha reductase inhibitor (finasteride 5 mg), discussed side effects   - RTC in one months for IPS'S and PVR   - PSA     Return in about 1 month (around 05/29/2017) for IPSS and PVR.  These notes generated with voice recognition software. I apologize for typographical errors.  Michiel Cowboy, PA-C  [facility] [address] [place], [place] [address] [phone]

## 2017-04-28 ENCOUNTER — Encounter: Payer: Self-pay | Admitting: Urology

## 2017-04-28 ENCOUNTER — Ambulatory Visit: Payer: BLUE CROSS/BLUE SHIELD | Admitting: Urology

## 2017-04-28 VITALS — BP 131/91 | HR 71 | Ht 66.0 in | Wt 181.3 lb

## 2017-04-28 DIAGNOSIS — N401 Enlarged prostate with lower urinary tract symptoms: Secondary | ICD-10-CM

## 2017-04-28 DIAGNOSIS — R300 Dysuria: Secondary | ICD-10-CM | POA: Diagnosis not present

## 2017-04-28 LAB — URINALYSIS, COMPLETE
BILIRUBIN UA: NEGATIVE
GLUCOSE, UA: NEGATIVE
KETONES UA: NEGATIVE
LEUKOCYTES UA: NEGATIVE
Nitrite, UA: NEGATIVE
PH UA: 6 (ref 5.0–7.5)
SPEC GRAV UA: 1.015 (ref 1.005–1.030)
Urobilinogen, Ur: 0.2 mg/dL (ref 0.2–1.0)

## 2017-04-28 LAB — BLADDER SCAN AMB NON-IMAGING: SCAN RESULT: 145

## 2017-04-28 MED ORDER — FINASTERIDE 5 MG PO TABS: 5.0000 mg | ORAL_TABLET | Freq: Every day | ORAL | 3 refills | Status: DC

## 2017-04-28 MED ORDER — TAMSULOSIN HCL 0.4 MG PO CAPS
0.4000 mg | ORAL_CAPSULE | Freq: Every day | ORAL | 3 refills | Status: DC
Start: 2017-04-28 — End: 2020-04-02

## 2017-04-28 NOTE — Progress Notes (Signed)
Venipuncture: L arm 12:25pm

## 2017-04-29 ENCOUNTER — Telehealth: Payer: Self-pay

## 2017-04-29 LAB — PSA: Prostate Specific Ag, Serum: 1.1 ng/mL (ref 0.0–4.0)

## 2017-04-29 NOTE — Telephone Encounter (Signed)
-----   Message from Harle BattiestShannon A McGowan, PA-C sent at 04/29/2017  7:48 AM EDT ----- Please let Mr. Marney DoctorMcGhee know that his PSA is norma.

## 2017-04-29 NOTE — Telephone Encounter (Signed)
Spoke with pt in reference to lab results. Pt voiced understanding.  

## 2017-05-29 NOTE — Progress Notes (Signed)
06/01/2017 9:54 AM   Gordon Sexton Jan 06, 1971 [id number]  Referring provider: Lorre Munroe, NP [address] [place], [place] [address]  Chief Complaint  Patient presents with  . Dysuria    HPI: 46 yo diabetic AAM who presents today for a one month follow up for dysuria and BPH with LU TS.  Background history Patient is a 46 year old [place] American male who is referred by Lorre Munroe, NP for dysuria.  He has a history of NGU last year.  At that time, he was having penile discharge and dysuria.  He was treated with antibiotics and retested.  He states he current symptoms consist of the dysuria only and no discharge.  This has been occurring since he STI.  He denies gross hematuria, suprapubic pain, abdominal pain or flank pain.  He is having lower back pain.  He has been having to get up two to three times a night to use the restroom since 2012.  He has been having day time frequency four to five episode during the day.  He has not had any recent fevers, chills, nausea or vomiting.  He does not have a history of nephrolithiasis, GU surgery or GU trauma.  He is sexually active.  His IPSS score today is 29, which is severe lower urinary tract symptomatology.  He is mostly dissatisfied with his quality life due to his urinary symptoms. His PVR is 145 mL.  His major complaints today are frequency, dysuria, nocturia, incontinence, intermittency, painful intercourse and a weak urinary stream..  He has had these symptoms for five years.  He denies any dysuria, hematuria or suprapubic pain.  He also denies any recent fevers, chills, nausea or vomiting.  He does not have a family history of PCa.  BPH WITH LUTS  (prostate and/or bladder) His IPSS score today is 29, which is severe lower urinary tract symptomatology.  He is unhappy with his quality life due to his urinary symptoms. His PVR is 50 mL.  His previous IPSS score was 29/4.  His previous PVR is 145 mL.  His major complaints  today are frequency, urgency, dysuria, nocturia, incontinence, intermittency and a weak urinary stream.  He has had these symptoms for the last year.  He denies any dysuria, hematuria or suprapubic pain.  He currently taking tamsulosin 0.4 mg and finasteride 5 mg daily.  He did not find any relief in his urinary symptoms with taking these medications. He states the only time he felt relief this when he was treated for an STI's.  He states his symptoms abated for 2 months after taking the antibiotics.   His last STI tests were negative. He also denies any recent fevers, chills, nausea or vomiting.  He does not have a family history of PCa.      IPSS    Row Name 04/28/17 1100 06/01/17 0900       International Prostate Symptom Score   How often have you had the sensation of not emptying your bladder? Almost always Almost always    How often have you had to urinate less than every two hours? More than half the time Almost always    How often have you found you stopped and started again several times when you urinated? Almost always Almost always    How often have you found it difficult to postpone urination? Almost always Almost always    How often have you had a weak urinary stream? Almost always Almost  always    How often have you had to strain to start urination? Not at All Not at All    How many times did you typically get up at night to urinate? 5 Times 5 Times    Total IPSS Score 29 30      Quality of Life due to urinary symptoms   If you were to spend the rest of your life with your urinary condition just the way it is now how would you feel about that? Mostly Disatisfied Unhappy       Score:  1-7 Mild 8-19 Moderate 20-35 Severe   PMH: Past Medical History:  Diagnosis Date  . Diabetes mellitus without complication (HCC)   . Hypertension     Surgical History: History reviewed. No pertinent surgical history.  Home Medications:  Allergies as of 06/01/2017   No Known  Allergies     Medication List       Accurate as of 06/01/17  9:54 AM. Always use your most recent med list.          aluminum-magnesium hydroxide-simethicone 200-200-20 MG/5ML Susp Commonly known as:  MAALOX Take 30 mLs by mouth 4 (four) times daily -  before meals and at bedtime.   famotidine 20 MG tablet Commonly known as:  PEPCID Take 1 tablet (20 mg total) by mouth 2 (two) times daily.   finasteride 5 MG tablet Commonly known as:  PROSCAR Take 1 tablet (5 mg total) by mouth daily.   Fish Oil 1000 MG Caps Take 1 capsule by mouth daily.   glipiZIDE 5 MG tablet Commonly known as:  GLUCOTROL Take 1 tablet (5 mg total) by mouth 2 (two) times daily before a meal. MUST SCHEDULE ANNUAL EXAM FOR FURTHER REFILLS   lisinopril 5 MG tablet Commonly known as:  PRINIVIL,ZESTRIL Take 1 tablet (5 mg total) by mouth daily. MUST SCHEDULE ANNUAL EXAM FOR FURTHER REFILLS   metFORMIN 1000 MG tablet Commonly known as:  GLUCOPHAGE TAKE 1 TABLET (1,000 MG TOTAL) BY MOUTH 2 (TWO) TIMES DAILY WITH A MEAL.   ranitidine 150 MG tablet Commonly known as:  ZANTAC Take 150 mg by mouth 2 (two) times daily.   tamsulosin 0.4 MG Caps capsule Commonly known as:  FLOMAX Take 1 capsule (0.4 mg total) by mouth daily.            Discharge Care Instructions        Start     Ordered   06/01/17 0000  Bladder Scan (Post Void Residual) in office     06/01/17 0912      Allergies: No Known Allergies  Family History: Family History  Problem Relation Age of Onset  . Diabetes Sister   . Stroke Maternal Grandmother   . Cancer Neg Hx   . Hyperlipidemia Neg Hx   . Heart disease Neg Hx     Social History:  reports that he quit smoking about 15 years ago. He has never used smokeless tobacco. He reports that he drinks alcohol. He reports that he does not use drugs.  ROS: UROLOGY Frequent Urination?: Yes Hard to postpone urination?: No Burning/pain with urination?: Yes Get up at night to  urinate?: Yes Leakage of urine?: Yes Urine stream starts and stops?: Yes Trouble starting stream?: No Do you have to strain to urinate?: No Blood in urine?: No Urinary tract infection?: No Sexually transmitted disease?: No Injury to kidneys or bladder?: No Painful intercourse?: No Weak stream?: Yes Erection problems?: No Penile pain?: No  Gastrointestinal Nausea?: No Vomiting?: No Indigestion/heartburn?: Yes Diarrhea?: No Constipation?: No  Constitutional Fever: No Night sweats?: No Weight loss?: No Fatigue?: No  Skin Skin rash/lesions?: No Itching?: No  Eyes Blurred vision?: No Double vision?: No  Ears/Nose/Throat Sore throat?: No Sinus problems?: No  Hematologic/Lymphatic Swollen glands?: No Easy bruising?: No  Cardiovascular Leg swelling?: No Chest pain?: No  Respiratory Cough?: No Shortness of breath?: No  Endocrine Excessive thirst?: Yes  Musculoskeletal Back pain?: Yes Joint pain?: No  Neurological Headaches?: No Dizziness?: No  Psychologic Depression?: No Anxiety?: No  Physical Exam: BP 132/89 (BP Location: Left Arm, Patient Position: Sitting, Cuff Size: Large)   Pulse 92   Ht  (1.676 m)   Wt 183 lb 3.2 oz (83.1 kg)   BMI 29.57 kg/m   Constitutional: Well nourished. Alert and oriented, No acute distress. HEENT: [place] AT, moist mucus membranes. Trachea midline, no masses. Cardiovascular: No clubbing, cyanosis, or edema. Respiratory: Normal respiratory effort, no increased work of breathing. Skin: No rashes, bruises or suspicious lesions. Lymph: No cervical or inguinal adenopathy. Neurologic: Grossly intact, no focal deficits, moving all 4 extremities. Psychiatric: Normal mood and affect.  Laboratory Data: PSA History  1.13 ng/mL in 01/2016  1.1 ng/mL in 04/2017 Urinalysis Negative.  See EPIC.    I have reviewed the labs.   Pertinent Imaging: Results for Gordon, Sexton (MRN [id number]) as of 06/01/2017 09:39  Ref.  Range 06/01/2017 09:32  Scan Result Unknown 50    Assessment & Plan:    1. Dysuria  - patient did not find relief with the tamsulosin and finasteride in his urinary symptoms  - UA is negative  - urine sent for culture, GC/Chlamydia tests  - Hbg A1c is drawn today to see if DM is under good control and not contributing to his symptoms  - Patient is quite agitated with his urinary symptoms and wants a definitive answer as to why he is experiencing any symptoms - we will schedule him for cystoscopy to rule out CIS or possible stricture disease   2. BPH with LUTS  - IPSS score is 30/5, it is stable  - Continue conservative management, avoiding bladder irritants and timed voiding's  - most bothersome symptoms is/are dysuria   - continue tamsulosin 0.4 mg and finasteride 5 mg  - schedule cystoscopy        Return for Schedule cystoscopy.  These notes generated with voice recognition software. I apologize for typographical errors.  Michiel Cowboy, PA-C  [facility] [address], [address] [place], [place] [address] [phone]

## 2017-06-01 ENCOUNTER — Ambulatory Visit: Payer: BLUE CROSS/BLUE SHIELD | Admitting: Urology

## 2017-06-01 ENCOUNTER — Encounter: Payer: Self-pay | Admitting: Urology

## 2017-06-01 VITALS — BP 132/89 | HR 92 | Ht 66.0 in | Wt 183.2 lb

## 2017-06-01 DIAGNOSIS — R300 Dysuria: Secondary | ICD-10-CM

## 2017-06-01 DIAGNOSIS — N401 Enlarged prostate with lower urinary tract symptoms: Secondary | ICD-10-CM

## 2017-06-01 DIAGNOSIS — E1165 Type 2 diabetes mellitus with hyperglycemia: Secondary | ICD-10-CM | POA: Diagnosis not present

## 2017-06-01 LAB — URINALYSIS, COMPLETE
BILIRUBIN UA: NEGATIVE
Glucose, UA: NEGATIVE
Ketones, UA: NEGATIVE
LEUKOCYTES UA: NEGATIVE
Nitrite, UA: NEGATIVE
PH UA: 6 (ref 5.0–7.5)
Specific Gravity, UA: 1.015 (ref 1.005–1.030)
Urobilinogen, Ur: 1 mg/dL (ref 0.2–1.0)

## 2017-06-01 LAB — MICROSCOPIC EXAMINATION: RBC, UA: NONE SEEN /hpf (ref 0–?)

## 2017-06-01 LAB — BLADDER SCAN AMB NON-IMAGING: Scan Result: 50

## 2017-06-02 ENCOUNTER — Telehealth: Payer: Self-pay

## 2017-06-02 LAB — GC/CHLAMYDIA PROBE AMP
CHLAMYDIA, DNA PROBE: NEGATIVE
NEISSERIA GONORRHOEAE BY PCR: NEGATIVE

## 2017-06-02 LAB — HEMOGLOBIN A1C
ESTIMATED AVERAGE GLUCOSE: 183 mg/dL
Hgb A1c MFr Bld: 8 % — ABNORMAL HIGH (ref 4.8–5.6)

## 2017-06-02 NOTE — Telephone Encounter (Signed)
-----   Message from Harle Battiest, PA-C sent at 06/02/2017  8:26 AM EDT ----- Please let Mr. Gordon Sexton know that his HbgA1c is 8%.  This means his average blood sugar is around 183 which is high.  This may be contributing to his urinary symptoms.  His cultures are still pending.

## 2017-06-02 NOTE — Telephone Encounter (Signed)
Spoke with pt in reference to blood sugars, urinary symptoms, and ucx pending. Pt voiced understanding of whole conversation.

## 2017-06-03 ENCOUNTER — Telehealth: Payer: Self-pay

## 2017-06-03 LAB — URINE CULTURE: Organism ID, Bacteria: NO GROWTH

## 2017-06-03 NOTE — Telephone Encounter (Signed)
-----   Message from Harle Battiest, PA-C sent at 06/03/2017  7:48 AM EDT ----- Please let Mr. Buysse know that his urine culture and STI cultures are negative.  Antibiotics would not be helpful for his symptoms.  I suggest proceeding with the cystoscopy at this time.

## 2017-06-03 NOTE — Telephone Encounter (Signed)
Spoke with pt in reference to culture results and needing a cysto. Pt voiced understanding. Cysto appt was made at check out.

## 2017-06-29 ENCOUNTER — Encounter: Payer: Self-pay | Admitting: Urology

## 2017-06-29 ENCOUNTER — Ambulatory Visit (INDEPENDENT_AMBULATORY_CARE_PROVIDER_SITE_OTHER): Payer: BLUE CROSS/BLUE SHIELD | Admitting: Urology

## 2017-06-29 VITALS — BP 127/93 | HR 88 | Ht 66.0 in | Wt 180.0 lb

## 2017-06-29 DIAGNOSIS — N400 Enlarged prostate without lower urinary tract symptoms: Secondary | ICD-10-CM

## 2017-06-29 LAB — URINALYSIS, COMPLETE
Bilirubin, UA: NEGATIVE
Leukocytes, UA: NEGATIVE
NITRITE UA: NEGATIVE
PH UA: 5.5 (ref 5.0–7.5)
SPEC GRAV UA: 1.02 (ref 1.005–1.030)
Urobilinogen, Ur: 0.2 mg/dL (ref 0.2–1.0)

## 2017-06-29 LAB — MICROSCOPIC EXAMINATION
Bacteria, UA: NONE SEEN
Epithelial Cells (non renal): NONE SEEN /HPF
RBC, UA: NONE SEEN /HPF
WBC, UA: NONE SEEN /HPF

## 2017-06-29 MED ORDER — CIPROFLOXACIN HCL 500 MG PO TABS
500.0000 mg | ORAL_TABLET | Freq: Once | ORAL | Status: AC
  Administered 2017-06-29: 500 mg via ORAL

## 2017-06-29 MED ORDER — LIDOCAINE HCL 2 % EX GEL
1.0000 "application " | Freq: Once | CUTANEOUS | Status: AC
  Administered 2017-06-29: 1 via URETHRAL

## 2017-06-29 NOTE — Progress Notes (Signed)
Gordon Sexton 1971-04-06 [id number]  Referring provider: Lorre Munroe, NP [address] [place], [place] [address]  Chief Complaint  Patient presents with  . Dysuria    HPI:   1 - Lower Urinary Tract Symptoms / Dysuria / Urinary Urgency - years of spells irritative voiding since his early 14s. PVR normal. UA normal. UCX normal. GC normal. Multiple rounds of ABX for unclear indications without improvement. He is diabetic with A1c 8s. No improvement with trials of empiric tamsulosin or finasteride. He does have h/o STD. Cysto 06/2017 normal (no strictures / erythema)  2 - Prostate Screening - No FHX prostate cancer. He is Tree surgeon. 06/2017 - PSA 1.1 /    PMH: Past Medical History:  Diagnosis Date  . Diabetes mellitus without complication (HCC)   . Hypertension     Surgical History: History reviewed. No pertinent surgical history.  Home Medications:  Allergies as of 06/01/2017   No Known Allergies     Medication List       Accurate as of 06/01/17  9:54 AM. Always use your most recent med list.          aluminum-magnesium hydroxide-simethicone 200-200-20 MG/5ML Susp Commonly known as:  MAALOX Take 30 mLs by mouth 4 (four) times daily -  before meals and at bedtime.   famotidine 20 MG tablet Commonly known as:  PEPCID Take 1 tablet (20 mg total) by mouth 2 (two) times daily.   finasteride 5 MG tablet Commonly known as:  PROSCAR Take 1 tablet (5 mg total) by mouth daily.   Fish Oil 1000 MG Caps Take 1 capsule by mouth daily.   glipiZIDE 5 MG tablet Commonly known as:  GLUCOTROL Take 1 tablet (5 mg total) by mouth 2 (two) times daily before a meal. MUST SCHEDULE ANNUAL EXAM FOR FURTHER REFILLS   lisinopril 5 MG tablet Commonly known as:  PRINIVIL,ZESTRIL Take 1 tablet (5 mg total) by mouth daily. MUST SCHEDULE ANNUAL EXAM FOR FURTHER REFILLS   metFORMIN 1000 MG tablet Commonly known as:  GLUCOPHAGE TAKE 1 TABLET (1,000 MG TOTAL) BY  MOUTH 2 (TWO) TIMES DAILY WITH A MEAL.   ranitidine 150 MG tablet Commonly known as:  ZANTAC Take 150 mg by mouth 2 (two) times daily.   tamsulosin 0.4 MG Caps capsule Commonly known as:  FLOMAX Take 1 capsule (0.4 mg total) by mouth daily.            Discharge Care Instructions        Start     Ordered   06/01/17 0000  Bladder Scan (Post Void Residual) in office     06/01/17 0912      Allergies: No Known Allergies  Family History: Family History  Problem Relation Age of Onset  . Diabetes Sister   . Stroke Maternal Grandmother   . Cancer Neg Hx   . Hyperlipidemia Neg Hx   . Heart disease Neg Hx     Social History:  reports that he quit smoking about 15 years ago. He has never used smokeless tobacco. He reports that he drinks alcohol. He reports that he does not use drugs.  ROS: UROLOGY Frequent Urination?: Yes Hard to postpone urination?: No Burning/pain with urination?: Yes Get up at night to urinate?: Yes Leakage of urine?: Yes Urine stream starts and stops?: Yes Trouble starting stream?: No Do you have to strain to urinate?: No Blood in urine?: No Urinary tract infection?: No Sexually transmitted disease?: No Injury  to kidneys or bladder?: No Painful intercourse?: No Weak stream?: Yes Erection problems?: No Penile pain?: No  Gastrointestinal Nausea?: No Vomiting?: No Indigestion/heartburn?: Yes Diarrhea?: No Constipation?: No  Constitutional Fever: No Night sweats?: No Weight loss?: No Fatigue?: No  Skin Skin rash/lesions?: No Itching?: No  Eyes Blurred vision?: No Double vision?: No  Ears/Nose/Throat Sore throat?: No Sinus problems?: No  Hematologic/Lymphatic Swollen glands?: No Easy bruising?: No  Cardiovascular Leg swelling?: No Chest pain?: No  Respiratory Cough?: No Shortness of breath?: No  Endocrine Excessive thirst?: Yes  Musculoskeletal Back pain?: Yes Joint pain?: No  Neurological Headaches?:  No Dizziness?: No  Psychologic Depression?: No Anxiety?: No  Physical Exam: BP 132/89 (BP Location: Left Arm, Patient Position: Sitting, Cuff Size: Large)   Pulse 92   Ht  (1.676 m)   Wt 183 lb 3.2 oz (83.1 kg)   BMI 29.57 kg/m   Constitutional: Well nourished. Alert and oriented, No acute distress. HEENT: [place] AT, moist mucus membranes. Trachea midline, no masses. Cardiovascular: No clubbing, cyanosis, or edema. Respiratory: Normal respiratory effort, no increased work of breathing. Skin: No rashes, bruises or suspicious lesions. Lymph: No cervical or inguinal adenopathy. Neurologic: Grossly intact, no focal deficits, moving all 4 extremities. Psychiatric: Normal mood and affect.  Laboratory Data: PSA History  1.13 ng/mL in 01/2016  1.1 ng/mL in 04/2017 Urinalysis Negative.  See EPIC.    I have reviewed the labs.   Pertinent Imaging: Results for Gordon, Sexton (MRN [id number]) as of 06/01/2017 09:39  Ref. Range 06/01/2017 09:32  Scan Result Unknown 50     06/29/17  CC: No chief complaint on file.   HPI:  There were no vitals taken for this visit. NED. A&Ox3.   No respiratory distress   Abd soft, NT, ND Normal phallus with bilateral descended testicles  Cystoscopy Procedure Note  Patient identification was confirmed, informed consent was obtained, and patient was prepped using Betadine solution.  Lidocaine jelly was administered per urethral meatus.    Preoperative abx where received prior to procedure.     Pre-Procedure: - Inspection reveals a normal caliber ureteral meatus.  Procedure: The flexible cystoscope was introduced without difficulty - No urethral strictures/lesions are present. - Normal prostate  - Normal bladder neck - Bilateral ureteral orifices identified - Bladder mucosa  reveals no ulcers, tumors, or lesions - No bladder stones - No trabeculation  Retroflexion shows no additional findings   Post-Procedure: - Patient  tolerated the procedure well  Assessment/ Plan:   Assessment & Plan:    1 - Lower Urinary Tract Symptoms / Dysuria / Urinary Urgency - no evidence of obstruction or infection by exam, cysto. This is likely driven by hypersensitivity and diabetes / glycosuria. Rec PRN urinary analgesics such as AZO and continued tamsulosin if he fingds helpful. There are no surgical indicaitons.   2 - Prostate Screening - Up to date this year, rec continue annuallly until ate 70-75.        [facility] [address], [address] [place], [place] [address] [phone]

## 2017-07-18 IMAGING — CR DG CHEST 2V
1 series · 2 of 2 positions shown · non-contrast
Comparison: [DATE]/ [DATE] [DATE]

CLINICAL DATA: Chest pain for 2 days, left arm tingling

EXAM:
CHEST  2 VIEW

[Series 1: dg chest 2 view · 0.14mm/px · 2 of 2 slices shown]
[im 1/2]
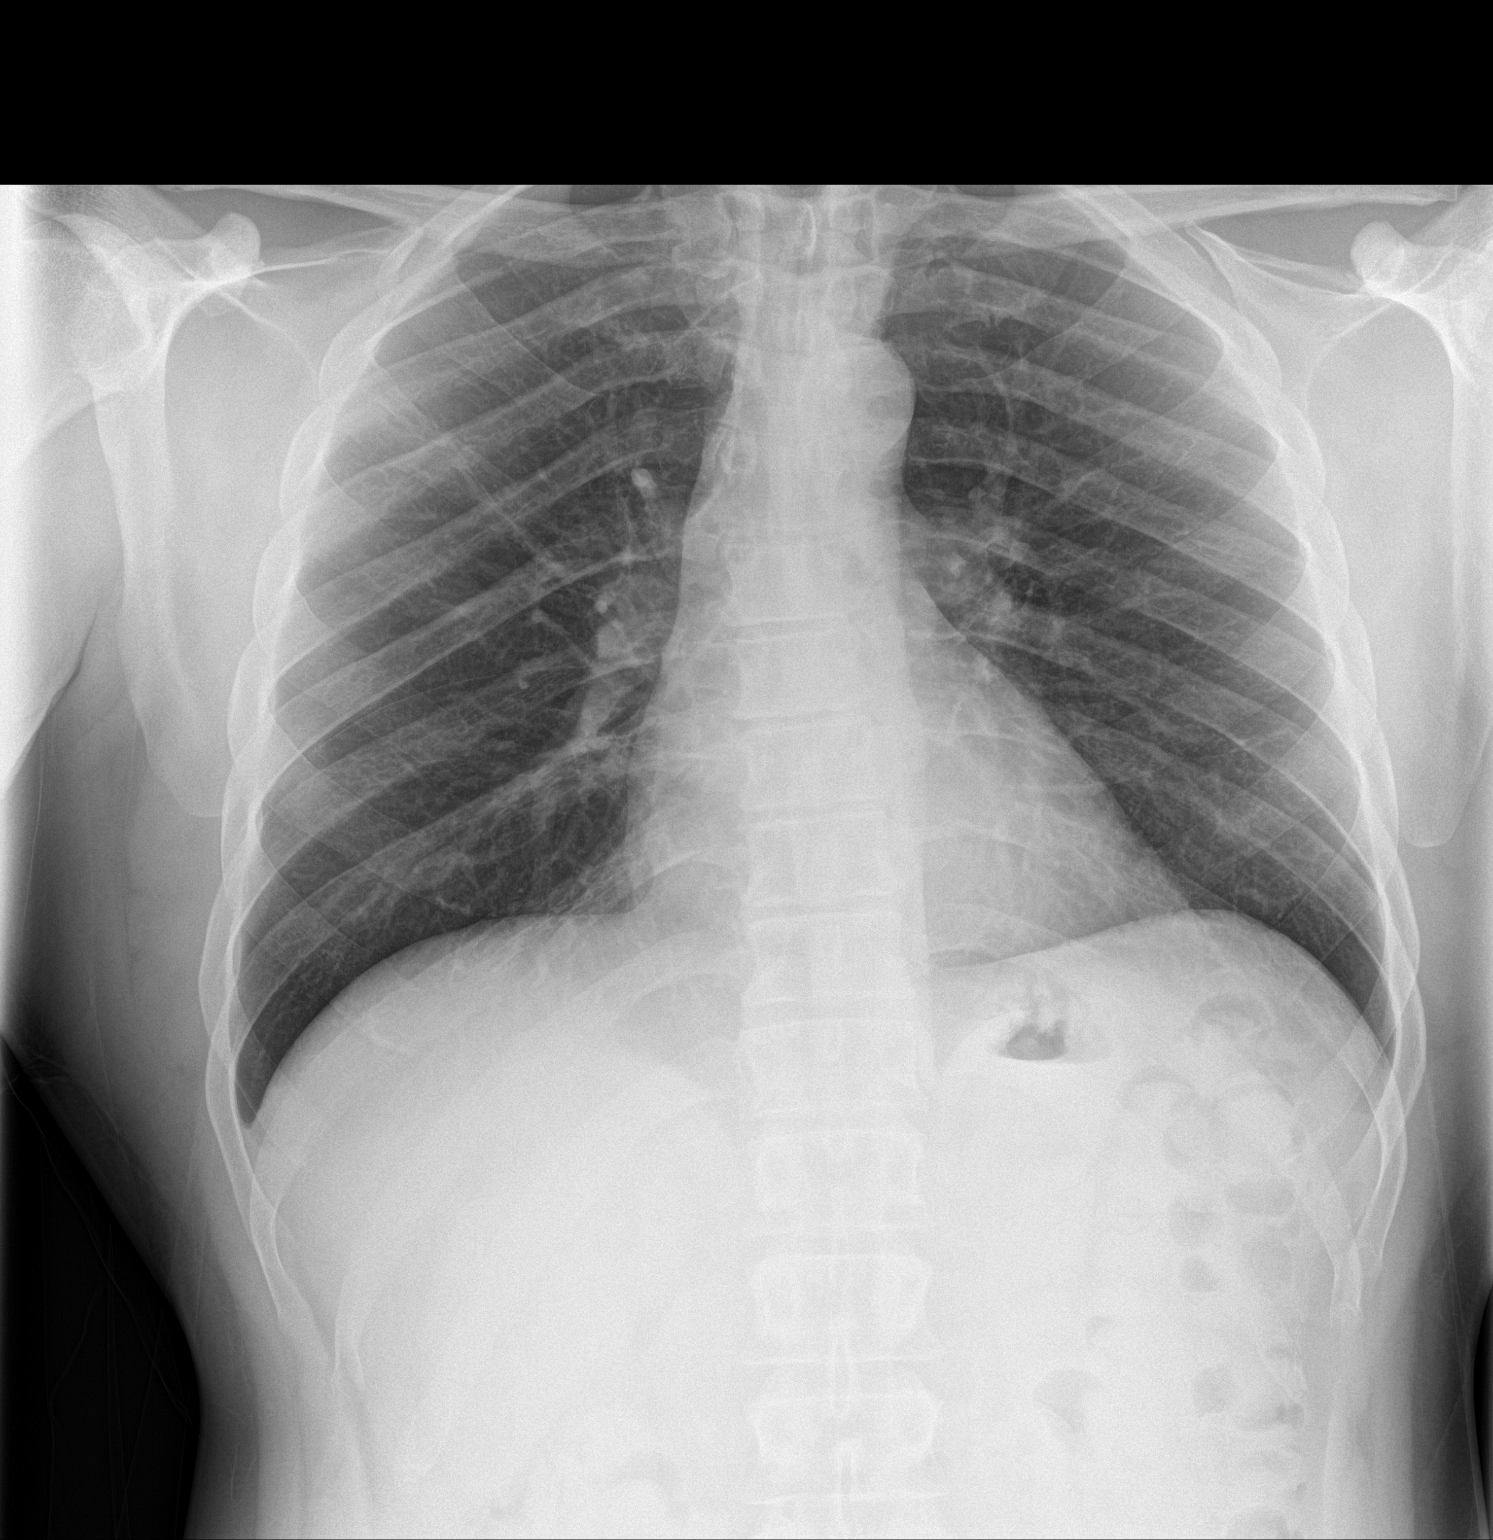
[im 2/2]
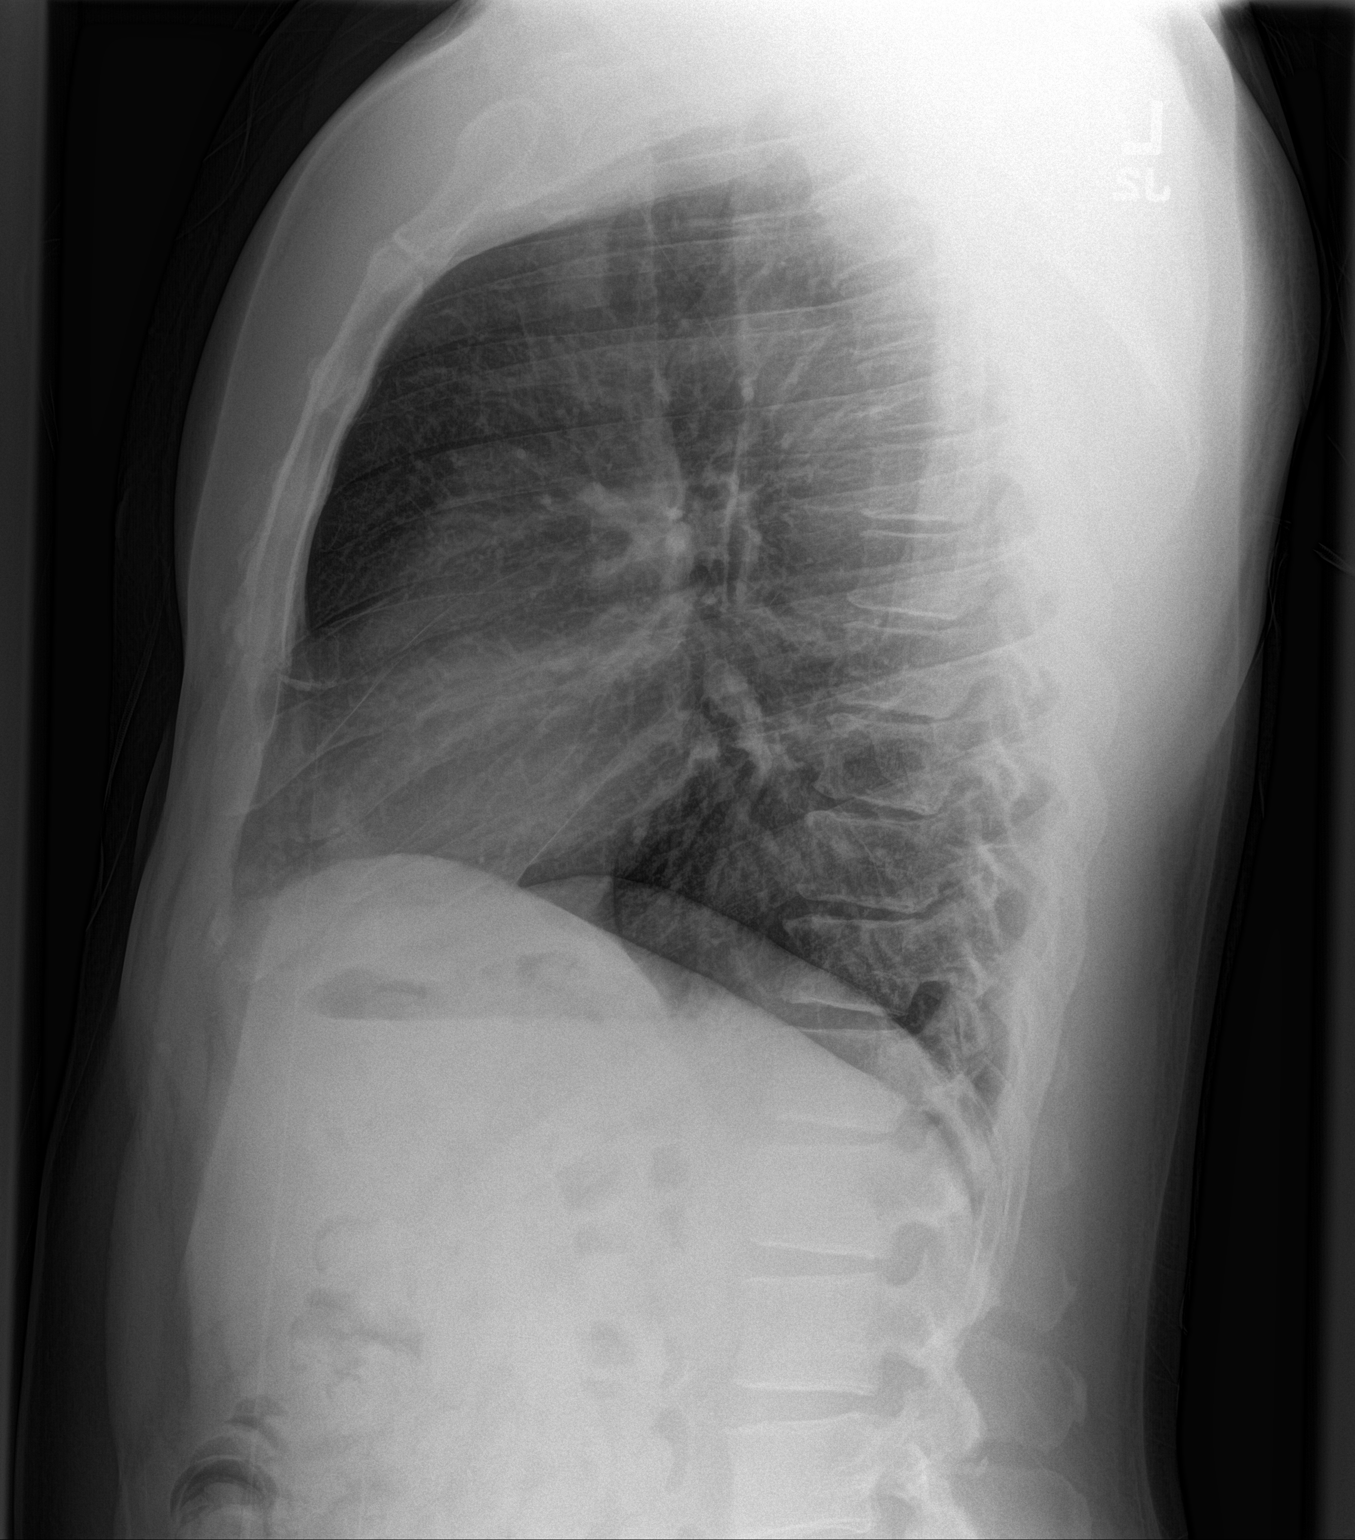

[2 of 2 positions shown; findings below may reference images not displayed]

FINDINGS: Cardiomediastinal silhouette is unremarkable. No acute infiltrate or
pleural effusion. No pulmonary edema. Bony thorax is unremarkable.
IMPRESSION: No active cardiopulmonary disease.

## 2018-09-17 DIAGNOSIS — R0789 Other chest pain: Secondary | ICD-10-CM | POA: Insufficient documentation

## 2019-01-11 DIAGNOSIS — I10 Essential (primary) hypertension: Secondary | ICD-10-CM | POA: Insufficient documentation

## 2020-02-10 ENCOUNTER — Ambulatory Visit: Payer: BC Managed Care – PPO | Admitting: Podiatry

## 2020-02-10 ENCOUNTER — Encounter: Payer: Self-pay | Admitting: Podiatry

## 2020-02-10 ENCOUNTER — Other Ambulatory Visit: Payer: Self-pay

## 2020-02-10 ENCOUNTER — Ambulatory Visit (INDEPENDENT_AMBULATORY_CARE_PROVIDER_SITE_OTHER): Payer: BC Managed Care – PPO

## 2020-02-10 DIAGNOSIS — S90851A Superficial foreign body, right foot, initial encounter: Secondary | ICD-10-CM

## 2020-02-10 DIAGNOSIS — M778 Other enthesopathies, not elsewhere classified: Secondary | ICD-10-CM

## 2020-02-10 DIAGNOSIS — W34010A Accidental discharge of airgun, initial encounter: Secondary | ICD-10-CM

## 2020-02-10 MED ORDER — HYDROCODONE-ACETAMINOPHEN 10-325 MG PO TABS: 1.0000 | ORAL_TABLET | Freq: Four times a day (QID) | ORAL | 0 refills | Status: AC | PRN

## 2020-02-10 MED ORDER — DOXYCYCLINE HYCLATE 100 MG PO TABS: 100.0000 mg | ORAL_TABLET | Freq: Two times a day (BID) | ORAL | 0 refills | Status: DC
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
## 2020-02-21 ENCOUNTER — Ambulatory Visit: Payer: BC Managed Care – PPO | Admitting: Podiatry

## 2020-02-21 ENCOUNTER — Encounter: Payer: Self-pay | Admitting: Podiatry

## 2020-02-21 ENCOUNTER — Other Ambulatory Visit: Payer: Self-pay

## 2020-02-21 ENCOUNTER — Other Ambulatory Visit: Payer: Self-pay | Admitting: Podiatry

## 2020-02-21 DIAGNOSIS — Z9889 Other specified postprocedural states: Secondary | ICD-10-CM

## 2020-02-21 DIAGNOSIS — S90851A Superficial foreign body, right foot, initial encounter: Secondary | ICD-10-CM

## 2020-02-21 DIAGNOSIS — W34010A Accidental discharge of airgun, initial encounter: Secondary | ICD-10-CM

## 2020-02-21 MED ORDER — DOXYCYCLINE HYCLATE 100 MG PO TABS: 100.0000 mg | ORAL_TABLET | Freq: Two times a day (BID) | ORAL | 0 refills | Status: DC
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
## 2020-02-24 LAB — WOUND CULTURE

## 2020-02-27 ENCOUNTER — Other Ambulatory Visit: Payer: Self-pay | Admitting: Podiatry

## 2020-02-27 DIAGNOSIS — S90851A Superficial foreign body, right foot, initial encounter: Secondary | ICD-10-CM

## 2020-03-06 ENCOUNTER — Encounter (HOSPITAL_COMMUNITY): Payer: Self-pay | Admitting: Emergency Medicine

## 2020-03-06 ENCOUNTER — Emergency Department (HOSPITAL_COMMUNITY): Payer: BC Managed Care – PPO

## 2020-03-06 ENCOUNTER — Other Ambulatory Visit: Payer: Self-pay

## 2020-03-06 ENCOUNTER — Ambulatory Visit: Payer: BC Managed Care – PPO | Admitting: Podiatry

## 2020-03-06 ENCOUNTER — Emergency Department (HOSPITAL_COMMUNITY)
Admission: EM | Admit: 2020-03-06 | Discharge: 2020-03-06 | Disposition: A | Discharge status: Home / Self Care | Payer: BC Managed Care – PPO | Attending: Emergency Medicine | Admitting: Emergency Medicine

## 2020-03-06 VITALS — HR 97

## 2020-03-06 DIAGNOSIS — Z5321 Procedure and treatment not carried out due to patient leaving prior to being seen by health care provider: Secondary | ICD-10-CM | POA: Insufficient documentation

## 2020-03-06 DIAGNOSIS — Z9889 Other specified postprocedural states: Secondary | ICD-10-CM

## 2020-03-06 DIAGNOSIS — R0789 Other chest pain: Secondary | ICD-10-CM | POA: Diagnosis present

## 2020-03-06 DIAGNOSIS — W34010A Accidental discharge of airgun, initial encounter: Secondary | ICD-10-CM

## 2020-03-06 DIAGNOSIS — S90851A Superficial foreign body, right foot, initial encounter: Secondary | ICD-10-CM

## 2020-03-06 LAB — BASIC METABOLIC PANEL
Anion gap: 10 (ref 5–15)
BUN: 9 mg/dL (ref 6–20)
CO2: 23 mmol/L (ref 22–32)
Calcium: 9 mg/dL (ref 8.9–10.3)
Chloride: 101 mmol/L (ref 98–111)
Creatinine, Ser: 0.98 mg/dL (ref 0.61–1.24)
GFR calc Af Amer: >60 mL/min (ref >60)
GFR calc non Af Amer: >60 mL/min (ref >60)
Glucose, Bld: 286 mg/dL — ABNORMAL HIGH (ref 70–99)
Potassium: 4 mmol/L (ref 3.5–5.1)
Sodium: 134 mmol/L — ABNORMAL LOW (ref 135–145)

## 2020-03-06 LAB — TROPONIN I (HIGH SENSITIVITY): Troponin I (High Sensitivity): 3 ng/L (ref <18)

## 2020-03-06 LAB — CBC
HCT: 39.7 % (ref 39.0–52.0)
Hemoglobin: 13.6 g/dL (ref 13.0–17.0)
MCH: 31.9 pg (ref 26.0–34.0)
MCHC: 34.3 g/dL (ref 30.0–36.0)
MCV: 93.2 fL (ref 80.0–100.0)
Platelets: 205 10*3/uL (ref 150–400)
RBC: 4.26 MIL/uL (ref 4.22–5.81)
RDW: 11.8 % (ref 11.5–15.5)
WBC: 5.6 10*3/uL (ref 4.0–10.5)
nRBC: 0 % (ref 0.0–0.2)

## 2020-03-06 MED ORDER — SODIUM CHLORIDE 0.9% FLUSH: 3.0000 mL | Freq: Once | INTRAVENOUS | Status: DC

## 2020-03-06 NOTE — ED Triage Notes (Signed)
Pt reports that for over a week had intermittent left sided that radiates to his back. Reports is diabetic and takes a ASA 81mg  daily.
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
## 2020-03-27 ENCOUNTER — Ambulatory Visit: Payer: BC Managed Care – PPO | Admitting: Podiatry

## 2020-04-02 ENCOUNTER — Other Ambulatory Visit: Payer: Self-pay

## 2020-04-02 ENCOUNTER — Encounter: Payer: Self-pay | Admitting: Cardiology

## 2020-04-02 ENCOUNTER — Ambulatory Visit: Payer: BC Managed Care – PPO | Admitting: Cardiology

## 2020-04-02 VITALS — BP 140/80 | HR 97 | Ht 66.0 in | Wt 178.0 lb

## 2020-04-02 DIAGNOSIS — E7800 Pure hypercholesterolemia, unspecified: Secondary | ICD-10-CM | POA: Diagnosis not present

## 2020-04-02 DIAGNOSIS — R079 Chest pain, unspecified: Secondary | ICD-10-CM | POA: Diagnosis not present

## 2020-04-02 DIAGNOSIS — I10 Essential (primary) hypertension: Secondary | ICD-10-CM | POA: Diagnosis not present

## 2020-04-02 DIAGNOSIS — R072 Precordial pain: Secondary | ICD-10-CM | POA: Diagnosis not present

## 2020-04-02 MED ORDER — METOPROLOL TARTRATE 100 MG PO TABS
100.0000 mg | ORAL_TABLET | Freq: Once | ORAL | 0 refills | Status: DC
Start: 2020-04-02 — End: 2020-04-26

## 2020-04-02 NOTE — Progress Notes (Signed)
Cardiology Office Note:    Date:  04/02/2020   ID:  Gordon Sexton, DOB [birth year], MRN [id number]  PCP:  Dion Body, MD  [facility] Cardiologist:  Kate Sable, MD  [place] Electrophysiologist:  None   Referring MD: Dion Body, MD   Chief Complaint  Patient presents with  . New Patient (Initial Visit)    Self ref for chest pain. Meds reviewed by the pt. verbally. Pt. c/o chest pain that comes and goes.    Gordon Sexton is a 49 y.o. male who is being seen today for the evaluation of chest pain at the request of Dion Body, MD.   History of Present Illness:    Gordon Sexton is a 49 y.o. male with a hx of hypertension, diabetes, hyperlipidemia  presenting with symptoms of chest pain.  Patient states having symptoms of chest discomfort over the past 2 years.  He was seen in the emergency room 2 years ago and was told he needed a stress test.  He did not follow-up for this.  Over the past 2 years he has had on and off chest pain.  2 weeks ago, he had severe chest discomfort that woke him up from sleep.  He rates his chest discomfort as 8/10 in severity, prompting him to present to the emergency room at [facility].  Patient did not stay for work-up.  He states having symptoms almost every other day, not related with exertion.  He has shortness of breath also not related with exertion.  Denies any history of heart disease.  Past Medical History:  Diagnosis Date  . Diabetes mellitus without complication ([place])   . Hypertension     History reviewed. No pertinent surgical history.  Current Medications: Current Meds  Medication Sig  . aluminum-magnesium hydroxide-simethicone (MAALOX) 086-578-46 MG/5ML SUSP Take 30 mLs by mouth 4 (four) times daily -  before meals and at bedtime.  Marland Kitchen aspirin 81 MG EC tablet Take 81 mg by mouth daily.   Marland Kitchen glipiZIDE (GLUCOTROL) 5 MG tablet Take 1 tablet (5 mg total) by mouth 2 (two) times daily before a  meal. MUST SCHEDULE ANNUAL EXAM FOR FURTHER REFILLS  . lisinopril (PRINIVIL,ZESTRIL) 5 MG tablet Take 1 tablet (5 mg total) by mouth daily. MUST SCHEDULE ANNUAL EXAM FOR FURTHER REFILLS  . lovastatin (MEVACOR) 20 MG tablet TAKE 1 TABLET BY MOUTH EVERY DAY AT NIGHT  . metFORMIN (GLUCOPHAGE) 1000 MG tablet TAKE 1 TABLET (1,000 MG TOTAL) BY MOUTH 2 (TWO) TIMES DAILY WITH A MEAL.  Marland Kitchen Omega-3 Fatty Acids (FISH OIL) 1000 MG CAPS Take 1 capsule by mouth daily.      Allergies:   Metformin   Social History   Socioeconomic History  . Marital status: Single    Spouse name: Not on file  . Number of children: Not on file  . Years of education: Not on file  . Highest education level: Not on file  Occupational History  . Not on file  Tobacco Use  . Smoking status: Former Smoker    Packs/day: 1.00    Years: 7.00    Pack years: 7.00    Types: Cigarettes    Quit date: 09/15/2001    Years since quitting: 18.5  . Smokeless tobacco: Never Used  Vaping Use  . Vaping Use: Never used  Substance and Sexual Activity  . Alcohol use: Yes    Alcohol/week: 0.0 standard drinks    Comment: occasional  . Drug use: No  . Sexual activity:  Yes    Birth control/protection: None  Other Topics Concern  . Not on file  Social History Narrative  . Not on file   Social Determinants of Health   Financial Resource Strain:   . Difficulty of Paying Living Expenses:   Food Insecurity:   . Worried About Charity fundraiser in the Last Year:   . Arboriculturist in the Last Year:   Transportation Needs:   . Film/video editor (Medical):   Marland Kitchen Lack of Transportation (Non-Medical):   Physical Activity:   . Days of Exercise per Week:   . Minutes of Exercise per Session:   Stress:   . Feeling of Stress :   Social Connections:   . Frequency of Communication with Friends and Family:   . Frequency of Social Gatherings with Friends and Family:   . Attends Religious Services:   . Active Member of Clubs or  Organizations:   . Attends Archivist Meetings:   Marland Kitchen Marital Status:      Family History: The patient's family history includes Diabetes in his mother and sister; Hypertension in his father; Stroke in his maternal grandmother. There is no history of Cancer, Hyperlipidemia, or Heart disease.  ROS:   Please see the history of present illness.     All other systems reviewed and are negative.  EKGs/Labs/Other Studies Reviewed:    The following studies were reviewed today:   EKG:  EKG is  ordered today.  The ekg ordered today demonstrates normal sinus rhythm, cannot rule out old inferior infarct.  Recent Labs: 03/06/2020: BUN 9; Creatinine, Ser 0.98; Hemoglobin 13.6; Platelets 205; Potassium 4.0; Sodium 134  Recent Lipid Panel    Component Value Date/Time   CHOL 144 01/08/2016 1042   TRIG 140.0 01/08/2016 1042   HDL 41.90 01/08/2016 1042   CHOLHDL 3 01/08/2016 1042   VLDL 28.0 01/08/2016 1042   LDLCALC 74 01/08/2016 1042    Physical Exam:    VS:  BP 140/80 (BP Location: Right Arm, Patient Position: Sitting, Cuff Size: Normal)   Pulse 97   Ht '5\' 6"'$  (1.676 m)   Wt 178 lb (80.7 kg)   SpO2 97%   BMI 28.73 kg/m     Wt Readings from Last 3 Encounters:  04/02/20 178 lb (80.7 kg)  06/29/17 180 lb (81.6 kg)  06/01/17 183 lb 3.2 oz (83.1 kg)     GEN:  Well nourished, well developed in no acute distress HEENT: Normal NECK: No JVD; No carotid bruits LYMPHATICS: No lymphadenopathy CARDIAC: RRR, no murmurs, rubs, gallops RESPIRATORY:  Clear to auscultation without rales, wheezing or rhonchi  ABDOMEN: Soft, non-tender, non-distended MUSCULOSKELETAL:  No edema; No deformity  SKIN: Warm and dry NEUROLOGIC:  Alert and oriented x 3 PSYCHIATRIC:  Normal affect   ASSESSMENT:    1. Chest pain of uncertain etiology   2. Essential hypertension   3. Pure hypercholesterolemia   4. Precordial pain    PLAN:    In order of problems listed above:  1. Patient with  nonspecific chest discomfort.  He has risk factors of hypertension, hyperlipidemia, and diabetes.  Symptoms are concerning for cardiac etiology especially with his risk factors.  Will evaluate patient with an echocardiogram, get a coronary CTA to evaluate CAD. 2. Patient with history of hypertension.  BP elevated.  Continue lisinopril as prescribed.  If on follow-up visit, blood pressure still elevated, plan to titrate lisinopril. 3. History of hyperlipidemia, continue statin as prescribed.  Previous LDL normal.  Follow-up after coronary CTA and echocardiogram.  This note was generated in part or whole with voice recognition software. Voice recognition is usually quite accurate but there are transcription errors that can and very often do occur. I apologize for any typographical errors that were not detected and corrected.  Medication Adjustments/Labs and Tests Ordered: Current medicines are reviewed at length with the patient today.  Concerns regarding medicines are outlined above.  Orders Placed This Encounter  Procedures  . CT CORONARY MORPH W/CTA COR W/SCORE W/CA W/CM &/OR WO/CM  . CT CORONARY FRACTIONAL FLOW RESERVE DATA PREP  . CT CORONARY FRACTIONAL FLOW RESERVE FLUID ANALYSIS  . Basic Metabolic Panel (BMET)  . EKG 12-Lead  . ECHOCARDIOGRAM COMPLETE   Meds ordered this encounter  Medications  . metoprolol tartrate (LOPRESSOR) 100 MG tablet    Sig: Take 1 tablet (100 mg total) by mouth once for 1 dose. Take 2 hours prior to your CT scan.    Dispense:  1 tablet    Refill:  0    Patient Instructions  Medication Instructions:   Take one dose of Lopressor (Metoprolol Tartrate): Take 1 tablet (100 mg total) by mouth once for 1 dose. Take 2 hours prior to your CT scan  Your physician recommends that you continue on your current medications as directed. Please refer to the Current Medication list given to you today.  *If you need a refill on your cardiac medications before your next  appointment, please call your pharmacy*   Lab Work:  If you have labs (blood work) drawn today and your tests are completely normal, you will receive your results only by: Marland Kitchen MyChart Message (if you have MyChart) OR . A paper copy in the mail If you have any lab test that is abnormal or we need to change your treatment, we will call you to review the results.   Testing/Procedures:  1.  Your physician has requested that you have an echocardiogram. Echocardiography is a painless test that uses sound waves to create images of your heart. It provides your doctor with information about the size and shape of your heart and how well your heart's chambers and valves are working. This procedure takes approximately one hour. There are no restrictions for this procedure.   2.  Your physician has requested that you have cardiac CT. Cardiac computed tomography (CT) is a painless test that uses an x-ray machine to take clear, detailed pictures of your heart.    [facility] [address] [place], [place] [address] [phone]   Please arrive 15 mins early for check-in and test prep.    Please follow these instructions carefully (unless otherwise directed):   Hold all erectile dysfunction medications at least 3 days (72 hrs) prior to test.    On the Night Before the Test: . Be sure to Drink plenty of water. . Do not consume any caffeinated/decaffeinated beverages or chocolate 12 hours prior to your test. . Do not take any antihistamines 12 hours prior to your test.   On the Day of the Test: . Drink plenty of water. Do not drink any water within one hour of the test. . Do not eat any food 4 hours prior to the test. . You may take your regular medications prior to the test.  . Take metoprolol (Lopressor) two hours prior to test.        After the Test: . Drink plenty of water. Marland Kitchen  After receiving IV contrast, you may experience a mild  flushed feeling. This is normal. . On occasion, you may experience a mild rash up to 24 hours after the test. This is not dangerous. If this occurs, you can take Benadryl 25 mg and increase your fluid intake. . If you experience trouble breathing, this can be serious. If it is severe call 911 IMMEDIATELY. If it is mild, please call our office. . If you take any of these medications: Glipizide/Metformin, Avandament, Glucavance, please do not take 48 hours after completing test unless otherwise instructed.   Once we have confirmed authorization from your insurance company, we will call you to set up a date and time for your test. Based on how quickly your insurance processes prior authorizations requests, please allow up to 4 weeks to be contacted for scheduling your Cardiac CT appointment. Be advised that routine Cardiac CT appointments could be scheduled as many as 8 weeks after your provider has ordered it.  For non-scheduling related questions, please contact the cardiac imaging nurse navigator should you have any questions/concerns: Marchia Bond, Cardiac Imaging Nurse Navigator Burley Saver, Interim Cardiac Imaging Nurse [place] [facility] Direct Office Dial: [phone]   For scheduling needs, including cancellations and rescheduling, please call Vivien Rota at [phone], option 3.      Follow-Up: At [facility], you and your health needs are our priority.  As part of our continuing mission to provide you with exceptional heart care, we have created designated Provider Care Teams.  These Care Teams include your primary Cardiologist (physician) and Advanced Practice Providers (APPs -  Physician Assistants and Nurse Practitioners) who all work together to provide you with the care you need, when you need it.  We recommend signing up for the patient portal called "MyChart".  Sign up information is provided on this After Visit Summary.  MyChart is used to connect with  patients for Virtual Visits (Telemedicine).  Patients are able to view lab/test results, encounter notes, upcoming appointments, etc.  Non-urgent messages can be sent to your provider as well.   To learn more about what you can do with MyChart, go to [email or web address].    Your next appointment:   After CTA and Echo   The format for your next appointment:   In Person  Provider:   Kate Sable, MD   Other Instructions   Echocardiogram An echocardiogram is a procedure that uses painless sound waves (ultrasound) to produce an image of the heart. Images from an echocardiogram can provide important information about:  Signs of coronary artery disease (CAD).  Aneurysm detection. An aneurysm is a weak or damaged part of an artery wall that bulges out from the normal force of blood pumping through the body.  Heart size and shape. Changes in the size or shape of the heart can be associated with certain conditions, including heart failure, aneurysm, and CAD.  Heart muscle function.  Heart valve function.  Signs of a past heart attack.  Fluid buildup around the heart.  Thickening of the heart muscle.  A tumor or infectious growth around the heart valves. Tell a health care provider about:  Any allergies you have.  All medicines you are taking, including vitamins, herbs, eye drops, creams, and over-the-counter medicines.  Any blood disorders you have.  Any surgeries you have had.  Any medical conditions you have.  Whether you are pregnant or may be pregnant. What are the risks? Generally, this is a safe procedure. However,  problems may occur, including:  Allergic reaction to dye (contrast) that may be used during the procedure. What happens before the procedure? No specific preparation is needed. You may eat and drink normally. What happens during the procedure?   An IV tube may be inserted into one of your veins.  You may receive contrast through this tube.  A contrast is an injection that improves the quality of the pictures from your heart.  A gel will be applied to your chest.  A wand-like tool (transducer) will be moved over your chest. The gel will help to transmit the sound waves from the transducer.  The sound waves will harmlessly bounce off of your heart to allow the heart images to be captured in real-time motion. The images will be recorded on a computer. The procedure may vary among health care providers and hospitals. What happens after the procedure?  You may return to your normal, everyday life, including diet, activities, and medicines, unless your health care provider tells you not to do that. Summary  An echocardiogram is a procedure that uses painless sound waves (ultrasound) to produce an image of the heart.  Images from an echocardiogram can provide important information about the size and shape of your heart, heart muscle function, heart valve function, and fluid buildup around your heart.  You do not need to do anything to prepare before this procedure. You may eat and drink normally.  After the echocardiogram is completed, you may return to your normal, everyday life, unless your health care provider tells you not to do that. This information is not intended to replace advice given to you by your health care provider. Make sure you discuss any questions you have with your health care provider. Document Revised: 12/23/2018 Document Reviewed: 10/04/2016 Elsevier Patient Education  2020 [place], Kate Sable, MD  04/02/2020 12:22 PM    [facility]

## 2020-04-02 NOTE — Patient Instructions (Signed)
Medication Instructions:   Take one dose of Lopressor (Metoprolol Tartrate): Take 1 tablet (100 mg total) by mouth once for 1 dose. Take 2 hours prior to your CT scan  Your physician recommends that you continue on your current medications as directed. Please refer to the Current Medication list given to you today.  *If you need a refill on your cardiac medications before your next appointment, please call your pharmacy*   Lab Work:  If you have labs (blood work) drawn today and your tests are completely normal, you will receive your results only by: . MyChart Message (if you have MyChart) OR . A paper copy in the mail If you have any lab test that is abnormal or we need to change your treatment, we will call you to review the results.   Testing/Procedures:  1.  Your physician has requested that you have an echocardiogram. Echocardiography is a painless test that uses sound waves to create images of your heart. It provides your doctor with information about the size and shape of your heart and how well your heart's chambers and valves are working. This procedure takes approximately one hour. There are no restrictions for this procedure.   2.  Your physician has requested that you have cardiac CT. Cardiac computed tomography (CT) is a painless test that uses an x-ray machine to take clear, detailed pictures of your heart.    [facility] [address] [place], [place] [address] [phone]   Please arrive 15 mins early for check-in and test prep.    Please follow these instructions carefully (unless otherwise directed):   Hold all erectile dysfunction medications at least 3 days (72 hrs) prior to test.    On the Night Before the Test: . Be sure to Drink plenty of water. . Do not consume any caffeinated/decaffeinated beverages or chocolate 12 hours prior to your test. . Do not take any antihistamines 12 hours prior to your  test.   On the Day of the Test: . Drink plenty of water. Do not drink any water within one hour of the test. . Do not eat any food 4 hours prior to the test. . You may take your regular medications prior to the test.  . Take metoprolol (Lopressor) two hours prior to test.        After the Test: . Drink plenty of water. . After receiving IV contrast, you may experience a mild flushed feeling. This is normal. . On occasion, you may experience a mild rash up to 24 hours after the test. This is not dangerous. If this occurs, you can take Benadryl 25 mg and increase your fluid intake. . If you experience trouble breathing, this can be serious. If it is severe call 911 IMMEDIATELY. If it is mild, please call our office. . If you take any of these medications: Glipizide/Metformin, Avandament, Glucavance, please do not take 48 hours after completing test unless otherwise instructed.   Once we have confirmed authorization from your insurance company, we will call you to set up a date and time for your test. Based on how quickly your insurance processes prior authorizations requests, please allow up to 4 weeks to be contacted for scheduling your Cardiac CT appointment. Be advised that routine Cardiac CT appointments could be scheduled as many as 8 weeks after your provider has ordered it.  For non-scheduling related questions, please contact the cardiac imaging nurse navigator should you have any questions/concerns: Sara Wallace,   Cardiac Imaging Nurse Navigator Burley Saver, Interim Cardiac Imaging Nurse Navigator [place] [facility] Direct Office Dial: [phone]   For scheduling needs, including cancellations and rescheduling, please call Vivien Rota at [phone], option 3.      Follow-Up: At [facility], you and your health needs are our priority.  As part of our continuing mission to provide you with exceptional heart care, we have created designated Provider Care Teams.   These Care Teams include your primary Cardiologist (physician) and Advanced Practice Providers (APPs -  Physician Assistants and Nurse Practitioners) who all work together to provide you with the care you need, when you need it.  We recommend signing up for the patient portal called "MyChart".  Sign up information is provided on this After Visit Summary.  MyChart is used to connect with patients for Virtual Visits (Telemedicine).  Patients are able to view lab/test results, encounter notes, upcoming appointments, etc.  Non-urgent messages can be sent to your provider as well.   To learn more about what you can do with MyChart, go to [email or web address].    Your next appointment:   After CTA and Echo   The format for your next appointment:   In Person  Provider:   Kate Sable, MD   Other Instructions   Echocardiogram An echocardiogram is a procedure that uses painless sound waves (ultrasound) to produce an image of the heart. Images from an echocardiogram can provide important information about:  Signs of coronary artery disease (CAD).  Aneurysm detection. An aneurysm is a weak or damaged part of an artery wall that bulges out from the normal force of blood pumping through the body.  Heart size and shape. Changes in the size or shape of the heart can be associated with certain conditions, including heart failure, aneurysm, and CAD.  Heart muscle function.  Heart valve function.  Signs of a past heart attack.  Fluid buildup around the heart.  Thickening of the heart muscle.  A tumor or infectious growth around the heart valves. Tell a health care provider about:  Any allergies you have.  All medicines you are taking, including vitamins, herbs, eye drops, creams, and over-the-counter medicines.  Any blood disorders you have.  Any surgeries you have had.  Any medical conditions you have.  Whether you are pregnant or may be pregnant. What are the  risks? Generally, this is a safe procedure. However, problems may occur, including:  Allergic reaction to dye (contrast) that may be used during the procedure. What happens before the procedure? No specific preparation is needed. You may eat and drink normally. What happens during the procedure?   An IV tube may be inserted into one of your veins.  You may receive contrast through this tube. A contrast is an injection that improves the quality of the pictures from your heart.  A gel will be applied to your chest.  A wand-like tool (transducer) will be moved over your chest. The gel will help to transmit the sound waves from the transducer.  The sound waves will harmlessly bounce off of your heart to allow the heart images to be captured in real-time motion. The images will be recorded on a computer. The procedure may vary among health care providers and hospitals. What happens after the procedure?  You may return to your normal, everyday life, including diet, activities, and medicines, unless your health care provider tells you not to do that. Summary  An echocardiogram is a procedure that uses painless  sound waves (ultrasound) to produce an image of the heart.  Images from an echocardiogram can provide important information about the size and shape of your heart, heart muscle function, heart valve function, and fluid buildup around your heart.  You do not need to do anything to prepare before this procedure. You may eat and drink normally.  After the echocardiogram is completed, you may return to your normal, everyday life, unless your health care provider tells you not to do that. This information is not intended to replace advice given to you by your health care provider. Make sure you discuss any questions you have with your health care provider. Document Revised: 12/23/2018 Document Reviewed: 10/04/2016 Elsevier Patient Education  2020 Elsevier Inc[employer]   

## 2020-04-03 LAB — BASIC METABOLIC PANEL
BUN/Creatinine Ratio: 12 (ref 9–20)
BUN: 12 mg/dL (ref 6–24)
CO2: 19 mmol/L — ABNORMAL LOW (ref 20–29)
Calcium: 9.9 mg/dL (ref 8.7–10.2)
Chloride: 100 mmol/L (ref 96–106)
Creatinine, Ser: 1.03 mg/dL (ref 0.76–1.27)
GFR calc Af Amer: 97 mL/min/{1.73_m2} (ref >59)
GFR calc non Af Amer: 84 mL/min/{1.73_m2} (ref >59)
Glucose: 295 mg/dL — ABNORMAL HIGH (ref 65–99)
Potassium: 4.5 mmol/L (ref 3.5–5.2)
Sodium: 139 mmol/L (ref 134–144)

## 2020-04-25 ENCOUNTER — Telehealth (HOSPITAL_COMMUNITY): Payer: Self-pay | Admitting: Emergency Medicine

## 2020-04-25 NOTE — Telephone Encounter (Signed)
Attempted to call patient regarding upcoming cardiac CT appointment. °Left message on voicemail with name and callback number °Jordy Hewins RN Navigator Cardiac Imaging °[place] [facility] °[phone] Office °[phone] Cell ° °

## 2020-04-26 ENCOUNTER — Other Ambulatory Visit (HOSPITAL_COMMUNITY): Payer: Self-pay | Admitting: *Deleted

## 2020-04-26 ENCOUNTER — Other Ambulatory Visit: Payer: Self-pay

## 2020-04-26 ENCOUNTER — Ambulatory Visit
Admission: RE | Admit: 2020-04-26 | Discharge: 2020-04-26 | Disposition: A | Discharge status: Home / Self Care | Payer: BC Managed Care – PPO | Source: Ambulatory Visit | Attending: Cardiology | Admitting: Cardiology

## 2020-04-26 DIAGNOSIS — R072 Precordial pain: Secondary | ICD-10-CM | POA: Diagnosis not present

## 2020-04-26 MED ORDER — METOPROLOL TARTRATE 100 MG PO TABS: 100.0000 mg | ORAL_TABLET | Freq: Once | ORAL | 0 refills | Status: DC

## 2020-04-26 MED ORDER — NITROGLYCERIN 0.4 MG SL SUBL: 0.8000 mg | SUBLINGUAL_TABLET | Freq: Once | SUBLINGUAL | Status: DC

## 2020-04-26 MED ORDER — DILTIAZEM HCL 25 MG/5ML IV SOLN
10.0000 mg | Freq: Once | INTRAVENOUS | Status: AC
  Administered 2020-04-26: 10 mg via INTRAVENOUS

## 2020-04-26 MED ORDER — METOPROLOL TARTRATE 5 MG/5ML IV SOLN
10.0000 mg | Freq: Once | INTRAVENOUS | Status: AC
  Administered 2020-04-26: 10 mg via INTRAVENOUS

## 2020-04-26 MED ORDER — IVABRADINE HCL 5 MG PO TABS: ORAL_TABLET | ORAL | 0 refills | Status: DC

## 2020-04-26 NOTE — Progress Notes (Signed)
Patient unable to get cardiac CT completed due to heart rate. Dr Azucena Cecil ordered patient to be reschedule and to have patient take Metoprolol 100 mg PO and Ivabradine 10 mg PO prior to arrival when rescheduled. Nurse navigator and scheduler notified, Patient verbalized understanding.

## 2020-04-27 ENCOUNTER — Telehealth: Payer: Self-pay

## 2020-04-27 ENCOUNTER — Other Ambulatory Visit: Payer: BC Managed Care – PPO

## 2020-04-27 NOTE — Telephone Encounter (Signed)
Dr. Azucena Cecil ordered Corlanor 5mg  2 tablets for patient to take 2 hours prior to CT scan. Insurance will not cover. Spoke with Dieu-j at [facility] who states two tablets will cost $23.30 with coupon card. Pharmacist states she is going to inform the patient.

## 2020-05-03 ENCOUNTER — Other Ambulatory Visit: Payer: Self-pay

## 2020-05-03 ENCOUNTER — Ambulatory Visit (INDEPENDENT_AMBULATORY_CARE_PROVIDER_SITE_OTHER): Payer: BC Managed Care – PPO

## 2020-05-03 DIAGNOSIS — E7800 Pure hypercholesterolemia, unspecified: Secondary | ICD-10-CM

## 2020-05-03 DIAGNOSIS — R072 Precordial pain: Secondary | ICD-10-CM

## 2020-05-03 DIAGNOSIS — R079 Chest pain, unspecified: Secondary | ICD-10-CM | POA: Diagnosis not present

## 2020-05-03 DIAGNOSIS — I10 Essential (primary) hypertension: Secondary | ICD-10-CM | POA: Diagnosis not present

## 2020-05-03 LAB — ECHOCARDIOGRAM COMPLETE
AR max vel: 2.45 cm2
AV Area VTI: 2.35 cm2
AV Area mean vel: 2.15 cm2
AV Mean grad: 4 mmHg
AV Peak grad: 7.8 mmHg
Ao pk vel: 1.4 m/s
Area-P 1/2: 2.93 cm2
Calc EF: 55 %
S' Lateral: 2.8 cm
Single Plane A2C EF: 53.7 %
Single Plane A4C EF: 53.8 %

## 2020-05-08 ENCOUNTER — Telehealth (HOSPITAL_COMMUNITY): Payer: Self-pay | Admitting: Emergency Medicine

## 2020-05-08 NOTE — Telephone Encounter (Signed)
Reaching out to patient to offer assistance regarding upcoming cardiac imaging study; pt verbalizes understanding of appt date/time, parking situation and where to check in, pre-test NPO status and medications ordered, and verified current allergies; name and call back number provided for further questions should they arise Karanveer Ramakrishnan RN Navigator Cardiac Imaging [facility] [phone] office [phone] cell 

## 2020-05-10 ENCOUNTER — Telehealth: Payer: Self-pay | Admitting: Cardiology

## 2020-05-10 ENCOUNTER — Ambulatory Visit
Admission: RE | Admit: 2020-05-10 | Discharge: 2020-05-10 | Disposition: A | Discharge status: Home / Self Care | Payer: BC Managed Care – PPO | Source: Ambulatory Visit | Attending: Cardiology | Admitting: Cardiology

## 2020-05-10 ENCOUNTER — Other Ambulatory Visit: Payer: Self-pay

## 2020-05-10 DIAGNOSIS — E7800 Pure hypercholesterolemia, unspecified: Secondary | ICD-10-CM | POA: Insufficient documentation

## 2020-05-10 DIAGNOSIS — R072 Precordial pain: Secondary | ICD-10-CM | POA: Insufficient documentation

## 2020-05-10 DIAGNOSIS — R079 Chest pain, unspecified: Secondary | ICD-10-CM

## 2020-05-10 DIAGNOSIS — I10 Essential (primary) hypertension: Secondary | ICD-10-CM | POA: Diagnosis not present

## 2020-05-10 LAB — POCT I-STAT CREATININE: Creatinine, Ser: 0.9 mg/dL (ref 0.61–1.24)

## 2020-05-10 MED ORDER — DILTIAZEM HCL 25 MG/5ML IV SOLN
15.0000 mg | Freq: Once | INTRAVENOUS | Status: AC
  Administered 2020-05-10: 15 mg via INTRAVENOUS

## 2020-05-10 MED ORDER — METOPROLOL TARTRATE 5 MG/5ML IV SOLN
10.0000 mg | Freq: Once | INTRAVENOUS | Status: AC
  Administered 2020-05-10: 10 mg via INTRAVENOUS

## 2020-05-10 NOTE — Progress Notes (Signed)
Patient unable to have Cardiac CT due to heart rate to high. Spoke with Dr Azucena Cecil who will schedule patient to have a different test. His nurse will contact patient for scheduling. Patient verbalized understanding.

## 2020-05-10 NOTE — Telephone Encounter (Signed)
° °  Spoke with Dr. Azucena Cecil and he wants to order a Myoview and reschedule the patients follow up appointment for after.   Called patient and relayed the above plan as well as instructed him on the prep for the Myoview. I printed the instructions and will send to patient as I informed him as he is not on MyChart.  Patient verbalized understanding and agreed with plan.

## 2020-05-10 NOTE — Telephone Encounter (Signed)
Patient went to have a CT scan done today, second attempt. Patients HR would not lower and patient was unable to complete the CT.  Patient would like a call back about what to do about his HR and upcoming appointment tomorrow  Please advise

## 2020-05-11 ENCOUNTER — Ambulatory Visit: Payer: BC Managed Care – PPO | Admitting: Cardiology

## 2020-05-17 ENCOUNTER — Other Ambulatory Visit: Payer: Self-pay

## 2020-05-17 ENCOUNTER — Encounter
Admission: RE | Admit: 2020-05-17 | Discharge: 2020-05-17 | Disposition: A | Discharge status: Home / Self Care | Payer: BC Managed Care – PPO | Source: Ambulatory Visit | Attending: Cardiology | Admitting: Cardiology

## 2020-05-17 DIAGNOSIS — R079 Chest pain, unspecified: Secondary | ICD-10-CM | POA: Insufficient documentation

## 2020-05-17 LAB — NM MYOCAR MULTI W/SPECT W/WALL MOTION / EF
LV dias vol: 69 mL (ref 62–150)
LV sys vol: 25 mL
Peak HR: 123 {beats}/min
Percent HR: 72 %
Rest HR: 75 {beats}/min
SDS: 4
SRS: 2
SSS: 5
TID: 1

## 2020-05-17 MED ORDER — TECHNETIUM TC 99M TETROFOSMIN IV KIT
30.0000 | PACK | Freq: Once | INTRAVENOUS | Status: AC | PRN
  Administered 2020-05-17: 27.347 via INTRAVENOUS

## 2020-05-17 MED ORDER — TECHNETIUM TC 99M TETROFOSMIN IV KIT
10.0000 | PACK | Freq: Once | INTRAVENOUS | Status: AC | PRN
  Administered 2020-05-17: 9.848 via INTRAVENOUS

## 2020-05-17 MED ORDER — REGADENOSON 0.4 MG/5ML IV SOLN
0.4000 mg | Freq: Once | INTRAVENOUS | Status: AC
  Administered 2020-05-17: 0.4 mg via INTRAVENOUS

## 2020-05-18 ENCOUNTER — Telehealth: Payer: Self-pay

## 2020-05-18 NOTE — Telephone Encounter (Signed)
Called patient and left a VM to call back.  

## 2020-05-23 ENCOUNTER — Ambulatory Visit: Payer: BC Managed Care – PPO | Admitting: Family

## 2020-07-26 ENCOUNTER — Other Ambulatory Visit: Payer: Self-pay

## 2020-07-26 ENCOUNTER — Inpatient Hospital Stay
Admission: EM | Admit: 2020-07-26 | Discharge: 2020-07-30 | DRG: 177 | Disposition: A | Discharge status: Home / Self Care | Payer: BC Managed Care – PPO | Attending: Hospitalist | Admitting: Hospitalist

## 2020-07-26 ENCOUNTER — Emergency Department: Payer: BC Managed Care – PPO

## 2020-07-26 DIAGNOSIS — Z8249 Family history of ischemic heart disease and other diseases of the circulatory system: Secondary | ICD-10-CM

## 2020-07-26 DIAGNOSIS — E876 Hypokalemia: Secondary | ICD-10-CM | POA: Diagnosis present

## 2020-07-26 DIAGNOSIS — Z7982 Long term (current) use of aspirin: Secondary | ICD-10-CM | POA: Diagnosis not present

## 2020-07-26 DIAGNOSIS — U071 COVID-19: Principal | ICD-10-CM | POA: Diagnosis present

## 2020-07-26 DIAGNOSIS — E119 Type 2 diabetes mellitus without complications: Secondary | ICD-10-CM | POA: Diagnosis not present

## 2020-07-26 DIAGNOSIS — R0902 Hypoxemia: Secondary | ICD-10-CM

## 2020-07-26 DIAGNOSIS — Z823 Family history of stroke: Secondary | ICD-10-CM

## 2020-07-26 DIAGNOSIS — Z87891 Personal history of nicotine dependence: Secondary | ICD-10-CM | POA: Diagnosis not present

## 2020-07-26 DIAGNOSIS — Z7984 Long term (current) use of oral hypoglycemic drugs: Secondary | ICD-10-CM | POA: Diagnosis not present

## 2020-07-26 DIAGNOSIS — I10 Essential (primary) hypertension: Secondary | ICD-10-CM | POA: Diagnosis present

## 2020-07-26 DIAGNOSIS — J9600 Acute respiratory failure, unspecified whether with hypoxia or hypercapnia: Secondary | ICD-10-CM | POA: Diagnosis present

## 2020-07-26 DIAGNOSIS — J1282 Pneumonia due to coronavirus disease 2019: Secondary | ICD-10-CM | POA: Diagnosis present

## 2020-07-26 DIAGNOSIS — Z833 Family history of diabetes mellitus: Secondary | ICD-10-CM | POA: Diagnosis not present

## 2020-07-26 DIAGNOSIS — Z79899 Other long term (current) drug therapy: Secondary | ICD-10-CM

## 2020-07-26 DIAGNOSIS — E1165 Type 2 diabetes mellitus with hyperglycemia: Secondary | ICD-10-CM | POA: Diagnosis present

## 2020-07-26 DIAGNOSIS — J9601 Acute respiratory failure with hypoxia: Secondary | ICD-10-CM | POA: Diagnosis present

## 2020-07-26 DIAGNOSIS — Z888 Allergy status to other drugs, medicaments and biological substances status: Secondary | ICD-10-CM | POA: Diagnosis not present

## 2020-07-26 DIAGNOSIS — D696 Thrombocytopenia, unspecified: Secondary | ICD-10-CM | POA: Diagnosis present

## 2020-07-26 LAB — CBC WITH DIFFERENTIAL/PLATELET
Abs Immature Granulocytes: 0.02 10*3/uL (ref 0.00–0.07)
Basophils Absolute: 0 10*3/uL (ref 0.0–0.1)
Basophils Relative: 0 %
Eosinophils Absolute: 0 10*3/uL (ref 0.0–0.5)
Eosinophils Relative: 0 %
HCT: 37.6 % — ABNORMAL LOW (ref 39.0–52.0)
Hemoglobin: 13.2 g/dL (ref 13.0–17.0)
Immature Granulocytes: 1 %
Lymphocytes Relative: 9 %
Lymphs Abs: 0.4 10*3/uL — ABNORMAL LOW (ref 0.7–4.0)
MCH: 30.5 pg (ref 26.0–34.0)
MCHC: 35.1 g/dL (ref 30.0–36.0)
MCV: 86.8 fL (ref 80.0–100.0)
Monocytes Absolute: 0.3 10*3/uL (ref 0.1–1.0)
Monocytes Relative: 8 %
Neutro Abs: 3.5 10*3/uL (ref 1.7–7.7)
Neutrophils Relative %: 82 %
Platelets: 103 10*3/uL — ABNORMAL LOW (ref 150–400)
RBC: 4.33 MIL/uL (ref 4.22–5.81)
RDW: 11.3 % — ABNORMAL LOW (ref 11.5–15.5)
WBC: 4.2 10*3/uL (ref 4.0–10.5)
nRBC: 0 % (ref 0.0–0.2)

## 2020-07-26 LAB — PROCALCITONIN: Procalcitonin: 0.28 ng/mL

## 2020-07-26 LAB — COMPREHENSIVE METABOLIC PANEL
ALT: 17 U/L (ref 0–44)
AST: 40 U/L (ref 15–41)
Albumin: 2.9 g/dL — ABNORMAL LOW (ref 3.5–5.0)
Alkaline Phosphatase: 47 U/L (ref 38–126)
Anion gap: 12 (ref 5–15)
BUN: 14 mg/dL (ref 6–20)
CO2: 23 mmol/L (ref 22–32)
Calcium: 7.9 mg/dL — ABNORMAL LOW (ref 8.9–10.3)
Chloride: 90 mmol/L — ABNORMAL LOW (ref 98–111)
Creatinine, Ser: 1.33 mg/dL — ABNORMAL HIGH (ref 0.61–1.24)
GFR, Estimated: >60 mL/min (ref >60)
Glucose, Bld: 425 mg/dL — ABNORMAL HIGH (ref 70–99)
Potassium: 3.5 mmol/L (ref 3.5–5.1)
Sodium: 125 mmol/L — ABNORMAL LOW (ref 135–145)
Total Bilirubin: 1.1 mg/dL (ref 0.3–1.2)
Total Protein: 7 g/dL (ref 6.5–8.1)

## 2020-07-26 LAB — BRAIN NATRIURETIC PEPTIDE: B Natriuretic Peptide: 22.3 pg/mL (ref 0.0–100.0)

## 2020-07-26 LAB — HIV ANTIBODY (ROUTINE TESTING W REFLEX): HIV Screen 4th Generation wRfx: NONREACTIVE

## 2020-07-26 LAB — FERRITIN: Ferritin: 5085 ng/mL — ABNORMAL HIGH (ref 24–336)

## 2020-07-26 LAB — GLUCOSE, CAPILLARY
Glucose-Capillary: 381 mg/dL — ABNORMAL HIGH (ref 70–99)
Glucose-Capillary: 254 mg/dL — ABNORMAL HIGH (ref 70–99)

## 2020-07-26 LAB — TROPONIN I (HIGH SENSITIVITY)
Troponin I (High Sensitivity): 13 ng/L (ref <18)
Troponin I (High Sensitivity): 11 ng/L (ref <18)

## 2020-07-26 MED ORDER — SODIUM CHLORIDE 0.9 % IV SOLN
100.0000 mg | Freq: Every day | INTRAVENOUS | Status: AC
  Administered 2020-07-27 – 2020-07-30 (×4): 100 mg via INTRAVENOUS
  Filled 2020-07-26 (×4): qty 20

## 2020-07-26 MED ORDER — OMEGA-3-ACID ETHYL ESTERS 1 G PO CAPS
1.0000 g | ORAL_CAPSULE | Freq: Every day | ORAL | Status: DC
  Administered 2020-07-26 – 2020-07-30 (×5): 1 g via ORAL
  Filled 2020-07-26 (×6): qty 1

## 2020-07-26 MED ORDER — ALUM & MAG HYDROXIDE-SIMETH 200-200-20 MG/5ML PO SUSP
30.0000 mL | Freq: Three times a day (TID) | ORAL | Status: DC
  Administered 2020-07-26: 30 mL via ORAL
  Filled 2020-07-26 (×4): qty 30

## 2020-07-26 MED ORDER — ZINC SULFATE 220 (50 ZN) MG PO CAPS
220.0000 mg | ORAL_CAPSULE | Freq: Every day | ORAL | Status: DC
  Administered 2020-07-26 – 2020-07-30 (×5): 220 mg via ORAL
  Filled 2020-07-26 (×5): qty 1

## 2020-07-26 MED ORDER — PRAVASTATIN SODIUM 20 MG PO TABS
20.0000 mg | ORAL_TABLET | Freq: Every day | ORAL | Status: DC
  Administered 2020-07-26 – 2020-07-29 (×4): 20 mg via ORAL
  Filled 2020-07-26 (×5): qty 1

## 2020-07-26 MED ORDER — METHYLPREDNISOLONE SODIUM SUCC 40 MG IJ SOLR
0.5000 mg/kg | Freq: Two times a day (BID) | INTRAMUSCULAR | Status: DC
  Administered 2020-07-26 – 2020-07-27 (×3): 38.4 mg via INTRAVENOUS
  Filled 2020-07-26 (×4): qty 1

## 2020-07-26 MED ORDER — ONDANSETRON HCL 4 MG/2ML IJ SOLN
4.0000 mg | Freq: Four times a day (QID) | INTRAMUSCULAR | Status: DC | PRN
  Administered 2020-07-28: 4 mg via INTRAVENOUS
  Filled 2020-07-26: qty 2

## 2020-07-26 MED ORDER — SODIUM CHLORIDE 0.9% FLUSH
3.0000 mL | Freq: Two times a day (BID) | INTRAVENOUS | Status: DC
  Administered 2020-07-26 – 2020-07-30 (×9): 3 mL via INTRAVENOUS

## 2020-07-26 MED ORDER — ACETAMINOPHEN 325 MG PO TABS
650.0000 mg | ORAL_TABLET | Freq: Four times a day (QID) | ORAL | Status: DC | PRN
  Administered 2020-07-28 – 2020-07-30 (×3): 650 mg via ORAL
  Filled 2020-07-26 (×3): qty 2

## 2020-07-26 MED ORDER — GUAIFENESIN-DM 100-10 MG/5ML PO SYRP
10.0000 mL | ORAL_SOLUTION | ORAL | Status: DC | PRN
  Administered 2020-07-26 – 2020-07-28 (×7): 10 mL via ORAL
  Filled 2020-07-26 (×7): qty 10

## 2020-07-26 MED ORDER — SODIUM CHLORIDE 0.9% FLUSH: 3.0000 mL | INTRAVENOUS | Status: DC | PRN

## 2020-07-26 MED ORDER — ASPIRIN EC 81 MG PO TBEC
81.0000 mg | DELAYED_RELEASE_TABLET | Freq: Every day | ORAL | Status: DC
  Administered 2020-07-26 – 2020-07-30 (×5): 81 mg via ORAL
  Filled 2020-07-26 (×5): qty 1

## 2020-07-26 MED ORDER — PREDNISONE 50 MG PO TABS: 50.0000 mg | ORAL_TABLET | Freq: Every day | ORAL | Status: DC

## 2020-07-26 MED ORDER — LISINOPRIL 5 MG PO TABS
5.0000 mg | ORAL_TABLET | Freq: Every day | ORAL | Status: DC
  Administered 2020-07-26 – 2020-07-30 (×5): 5 mg via ORAL
  Filled 2020-07-26 (×5): qty 1

## 2020-07-26 MED ORDER — SODIUM CHLORIDE 0.9 % IV SOLN
200.0000 mg | Freq: Once | INTRAVENOUS | Status: AC
  Administered 2020-07-26: 200 mg via INTRAVENOUS
  Filled 2020-07-26: qty 40

## 2020-07-26 MED ORDER — SODIUM CHLORIDE 0.9 % IV SOLN: 250.0000 mL | INTRAVENOUS | Status: DC | PRN

## 2020-07-26 MED ORDER — INSULIN DETEMIR 100 UNIT/ML ~~LOC~~ SOLN
0.1000 [IU]/kg | Freq: Two times a day (BID) | SUBCUTANEOUS | Status: DC
  Administered 2020-07-26 (×2): 8 [IU] via SUBCUTANEOUS
  Filled 2020-07-26 (×4): qty 0.08

## 2020-07-26 MED ORDER — METOPROLOL TARTRATE 50 MG PO TABS: 100.0000 mg | ORAL_TABLET | Freq: Once | ORAL | Status: DC

## 2020-07-26 MED ORDER — ASCORBIC ACID 500 MG PO TABS
500.0000 mg | ORAL_TABLET | Freq: Every day | ORAL | Status: DC
  Administered 2020-07-26 – 2020-07-30 (×5): 500 mg via ORAL
  Filled 2020-07-26 (×5): qty 1

## 2020-07-26 MED ORDER — ALBUTEROL SULFATE HFA 108 (90 BASE) MCG/ACT IN AERS
2.0000 | INHALATION_SPRAY | Freq: Four times a day (QID) | RESPIRATORY_TRACT | Status: DC
  Administered 2020-07-26 – 2020-07-30 (×15): 2 via RESPIRATORY_TRACT
  Filled 2020-07-26: qty 6.7

## 2020-07-26 MED ORDER — ONDANSETRON HCL 4 MG PO TABS: 4.0000 mg | ORAL_TABLET | Freq: Four times a day (QID) | ORAL | Status: DC | PRN

## 2020-07-26 MED ORDER — INSULIN ASPART 100 UNIT/ML ~~LOC~~ SOLN
0.0000 [IU] | Freq: Three times a day (TID) | SUBCUTANEOUS | Status: DC
  Administered 2020-07-26: 11 [IU] via SUBCUTANEOUS
  Administered 2020-07-26: 18:00:00 5 [IU] via SUBCUTANEOUS
  Administered 2020-07-27: 17:00:00 20 [IU] via SUBCUTANEOUS
  Administered 2020-07-27 (×2): 11 [IU] via SUBCUTANEOUS
  Administered 2020-07-28: 12:00:00 5 [IU] via SUBCUTANEOUS
  Administered 2020-07-28: 8 [IU] via SUBCUTANEOUS
  Administered 2020-07-28: 08:00:00 2 [IU] via SUBCUTANEOUS
  Administered 2020-07-29: 3 [IU] via SUBCUTANEOUS
  Administered 2020-07-29: 12:00:00 5 [IU] via SUBCUTANEOUS
  Administered 2020-07-29: 19:00:00 8 [IU] via SUBCUTANEOUS
  Administered 2020-07-30: 3 [IU] via SUBCUTANEOUS
  Administered 2020-07-30: 8 [IU] via SUBCUTANEOUS
  Administered 2020-07-30: 2 [IU] via SUBCUTANEOUS
  Filled 2020-07-26 (×14): qty 1

## 2020-07-26 MED ORDER — ENOXAPARIN SODIUM 40 MG/0.4ML ~~LOC~~ SOLN
40.0000 mg | SUBCUTANEOUS | Status: DC
  Administered 2020-07-26 – 2020-07-30 (×5): 40 mg via SUBCUTANEOUS
  Filled 2020-07-26 (×5): qty 0.4

## 2020-07-26 NOTE — H&P (Signed)
History and Physical    Gordon Sexton QVZ:[id number] DOB: Apr 23, 1971 DOA: 07/26/2020  PCP: Gordon Ivan, MD   Patient coming from: Home  I have personally briefly reviewed patient's old medical records in [facility]  Chief Complaint: Shortness of breath  HPI: Gordon Sexton is a 49 y.o. male with medical history significant for type II diabetes mellitus on oral hypoglycemic agents and hypertension who was sent to the emergency room from [facility] clinic where he had gone to receive monoclonal antibodies for his positive Covid test.  Patient's COVID-19 PCR test was positive on 07/22/20.  He has been sick for about 4 days with complaints of fever, myalgias, poor oral intake and a cough productive of clear phlegm.  He denies having any nausea, no vomiting, no diarrhea, no urinary symptoms, no dizziness or lightheadedness. He was noted to have room air pulse oximetry of 84 to 85% at the outpatient office and was placed on 2 L of oxygen with improvement in his pulse oximetry to 91%. Patient is unvaccinated Labs show a BNP of 22, white count of 4.2, hemoglobin 13.2, hematocrit 37.6, MCV 86.8, RDW 11.3, platelet count 103 Chest x-ray reviewed by me shows bilateral lung opacities compatible with COVID-19 pneumonia Twelve-lead EKG reviewed by me shows sinus tachycardia    ED Course: Patient is a 49 year old unvaccinated male who was sent to the emergency room for evaluation of hypoxia. Patient was diagnosed with COVID-19 viral infection on 07/22/20 and had gone to an outpatient clinic to receive monoclonal antibodies but was noted to be hypoxic with room air pulse oximetry of 81 to 84%. He was placed on 2 L of oxygen and referred to the emergency room for further evaluation. Chest x-ray shows bilateral opacities consistent with COVID-19 pneumonia. Patient will be admitted to the hospital for further evaluation.    Review of Systems: As per HPI otherwise 10 point review of systems  negative.    Past Medical History:  Diagnosis Date  . Diabetes mellitus without complication (HCC)   . Hypertension     History reviewed. No pertinent surgical history.   reports that he quit smoking about 18 years ago. His smoking use included cigarettes. He has a 7.00 pack-year smoking history. He has never used smokeless tobacco. He reports current alcohol use. He reports that he does not use drugs.  Allergies  Allergen Reactions  . Metformin Diarrhea    Family History  Problem Relation Age of Onset  . Diabetes Sister   . Stroke Maternal Grandmother   . Diabetes Mother   . Hypertension Father   . Cancer Neg Hx   . Hyperlipidemia Neg Hx   . Heart disease Neg Hx      Prior to Admission medications   Medication Sig Start Date End Date Taking? Authorizing Provider  aluminum-magnesium hydroxide-simethicone (MAALOX) 200-200-20 MG/5ML SUSP Take 30 mLs by mouth 4 (four) times daily -  before meals and at bedtime. 03/13/16   Sharman Cheek, MD  aspirin 81 MG EC tablet Take 81 mg by mouth daily.     [provider]  glipiZIDE (GLUCOTROL) 5 MG tablet Take 1 tablet (5 mg total) by mouth 2 (two) times daily before a meal. MUST SCHEDULE ANNUAL EXAM FOR FURTHER REFILLS 07/07/16   Lorre Munroe, NP  ivabradine (CORLANOR) 5 MG TABS tablet Take 2 tablets (10mg ) 2 hours prior to cardiac CT scan. 04/26/20   06/26/20, MD  lisinopril (PRINIVIL,ZESTRIL) 5 MG tablet Take 1 tablet (5 mg total) by  mouth daily. MUST SCHEDULE ANNUAL EXAM FOR FURTHER REFILLS 07/07/16   Lorre Munroe, NP  lovastatin (MEVACOR) 20 MG tablet TAKE 1 TABLET BY MOUTH EVERY DAY AT NIGHT 07/06/19   [provider]  metoprolol tartrate (LOPRESSOR) 100 MG tablet Take 1 tablet (100 mg total) by mouth once for 1 dose. Take 2 hours prior to your CT scan. 04/26/20 04/26/20  Debbe Odea, MD  Omega-3 Fatty Acids (FISH OIL) 1000 MG CAPS Take 1 capsule by mouth daily.     [provider]     Physical Exam: Vitals:   07/26/20 0845 07/26/20 0846 07/26/20 0847 07/26/20 1000  BP: (!) 122/91  (!) 129/91 106/82  Pulse:  (!) 109 (!) 109 97  Resp:  20 20 20   Temp:   (!) 100.9 F (38.3 C)   TempSrc:      SpO2:  91% 91% 92%  Weight:      Height:         Vitals:   07/26/20 0845 07/26/20 0846 07/26/20 0847 07/26/20 1000  BP: (!) 122/91  (!) 129/91 106/82  Pulse:  (!) 109 (!) 109 97  Resp:  20 20 20   Temp:   (!) 100.9 F (38.3 C)   TempSrc:      SpO2:  91% 91% 92%  Weight:      Height:        Constitutional: NAD, alert and oriented x 3 Eyes: PERRL, lids and conjunctivae normal ENMT: Mucous membranes are moist.  Neck: normal, supple, no masses, no thyromegaly Respiratory: Bilateral air entry, no wheezing, no crackles. Normal respiratory effort. No accessory muscle use.  Cardiovascular: Tachycardic, no murmurs / rubs / gallops. No extremity edema. 2+ pedal pulses. No carotid bruits.  Abdomen: no tenderness, no masses palpated. No hepatosplenomegaly. Bowel sounds positive.  Musculoskeletal: no clubbing / cyanosis. No joint deformity upper and lower extremities.  Skin: no rashes, lesions, ulcers.  Neurologic: No gross focal neurologic deficit. Psychiatric: Normal mood and affect.   Labs on Admission: I have personally reviewed following labs and imaging studies  CBC: Recent Labs  Lab 07/26/20 0855  WBC 4.2  NEUTROABS 3.5  HGB 13.2  HCT 37.6*  MCV 86.8  PLT 103*   Basic Metabolic Panel: No results for input(s): NA, K, CL, CO2, GLUCOSE, BUN, CREATININE, CALCIUM, MG, PHOS in the last 168 hours. GFR: CrCl cannot be calculated (Patient's most recent lab result is older than the maximum 21 days allowed.). Liver Function Tests: No results for input(s): AST, ALT, ALKPHOS, BILITOT, PROT, ALBUMIN in the last 168 hours. No results for input(s): LIPASE, AMYLASE in the last 168 hours. No results for input(s): AMMONIA in the last 168 hours. Coagulation Profile: No  results for input(s): INR, PROTIME in the last 168 hours. Cardiac Enzymes: No results for input(s): CKTOTAL, CKMB, CKMBINDEX, TROPONINI in the last 168 hours. BNP (last 3 results) No results for input(s): PROBNP in the last 8760 hours. HbA1C: No results for input(s): HGBA1C in the last 72 hours. CBG: No results for input(s): GLUCAP in the last 168 hours. Lipid Profile: No results for input(s): CHOL, HDL, LDLCALC, TRIG, CHOLHDL, LDLDIRECT in the last 72 hours. Thyroid Function Tests: No results for input(s): TSH, T4TOTAL, FREET4, T3FREE, THYROIDAB in the last 72 hours. Anemia Panel: No results for input(s): VITAMINB12, FOLATE, FERRITIN, TIBC, IRON, RETICCTPCT in the last 72 hours. Urine analysis:    Component Value Date/Time   COLORURINE YELLOW 07/13/2014 0305   APPEARANCEUR Clear 06/29/2017 0905  LABSPEC 1.011 07/13/2014 0305   PHURINE 5.0 07/13/2014 0305   GLUCOSEU Trace (A) 06/29/2017 0905   HGBUR NEGATIVE 07/13/2014 0305   BILIRUBINUR Negative 06/29/2017 0905   KETONESUR NEGATIVE 07/13/2014 0305   PROTEINUR 2+ (A) 06/29/2017 0905   PROTEINUR NEGATIVE 07/13/2014 0305   UROBILINOGEN negative 09/27/2014 1210   UROBILINOGEN 0.2 07/13/2014 0305   NITRITE Negative 06/29/2017 0905   NITRITE NEGATIVE 07/13/2014 0305   LEUKOCYTESUR Negative 06/29/2017 0905    Radiological Exams on Admission: DG Chest Portable 1 View  Result Date: 07/26/2020 CLINICAL DATA:  Decreased oxygen saturation.  COVID-19 positive. EXAM: PORTABLE CHEST 1 VIEW COMPARISON:  03/06/2020 FINDINGS: The cardiomediastinal silhouette is within normal limits. The lungs are hypoinflated. There are mixed interstitial and ill-defined airspace opacities bilaterally, primarily in the lung bases. No sizable pleural effusion or pneumothorax is identified. No acute osseous abnormality is seen. IMPRESSION: Bilateral lung opacities compatible with COVID-19 pneumonia with this history. Electronically Signed   By: Sebastian Ache M.D.    On: 07/26/2020 09:19    EKG: Independently reviewed.  Sinus tachycardia  Assessment/Plan Principal Problem:   Pneumonia due to COVID-19 virus Active Problems:   Essential hypertension   Diabetes mellitus without complication (HCC)   Thrombocytopenia (HCC)   Acute respiratory failure due to COVID-19 [facility])      Acute respiratory failure due to COVID-19 pneumonia  Patient is unvaccinated and his COVID-19 PCR test was positive on 07/22/20 He was noted to be hypoxic with room air pulse oximetry between 84 and 85% and improved with oxygen supplementation at 3 L with pulse oximetry in the low 90s Chest x-ray shows bilateral opacities We will start patient on remdesivir per protocol Place patient on Solu-Medrol and transition to oral prednisone Supportive care with antitussives and bronchodilator therapy    Type 2 diabetes mellitus Patient is at risk for hypoglycemia due to systemic steroids We will place patient on Levemir and NovoLog sliding scale coverage Maintain consistent carbohydrate diet    Essential hypertension Continue lisinopril    Thrombocytopenia Most likely related to acute viral illness Platelet count today is 103 compared to normal values in the past We will monitor closely for bleeding especially since patient is on anticoagulation       DVT prophylaxis: Lovenox Code Status: Full code Family Communication: Greater than 50% of time was spent discussing patient's condition and plan of care with him at the bedside. All questions and concerns have been addressed. He verbalizes understanding and agrees with the plan Disposition Plan: Back to previous home environment Consults called: None    Kemesha Mosey MD [facility]     07/26/2020, 10:41 AM

## 2020-07-26 NOTE — ED Provider Notes (Signed)
[facility] Emergency Department Provider Note   ____________________________________________   First MD Initiated Contact with Patient 07/26/20 [phone]     (approximate)  I have reviewed the triage vital signs and the nursing notes.   HISTORY  Chief Complaint Shortness of Breath    HPI Gordon Sexton is a 49 y.o. male who went to [place] to get monoclonal antibodies for his positive Covid test but his O2 was decreased so he was sent here.  [facility] clinic reported O2 sats of 84 to 85% on room air.  He went up to 91% on 2 L but when he sat up so I could listen to his lungs his sats on 2 L dropped down to 89 so I put him up to 3 L now.  He denies any fever but here his temperature is 100.9.  He denies any chest pain or tightness.  He does have a history of diabetes which he reports has been well controlled.  He also has a history of hypertension.         Past Medical History:  Diagnosis Date  . Diabetes mellitus without complication (HCC)   . Hypertension     Patient Active Problem List   Diagnosis Date Noted  . Essential hypertension 01/11/2019  . Other chest pain 09/17/2018  . Hypertriglyceridemia 12/26/2014  . Type 2 diabetes mellitus with hyperglycemia (HCC) 09/26/2014  . Chronic prostatitis 04/25/2013  . Increased frequency of urination 04/25/2013  . Penile pain 04/25/2013  . Urinary urgency 04/25/2013    History reviewed. No pertinent surgical history.  Prior to Admission medications   Medication Sig Start Date End Date Taking? Authorizing Provider  aluminum-magnesium hydroxide-simethicone (MAALOX) 200-200-20 MG/5ML SUSP Take 30 mLs by mouth 4 (four) times daily -  before meals and at bedtime. 03/13/16   Sharman Cheek, MD  aspirin 81 MG EC tablet Take 81 mg by mouth daily.     [provider]  glipiZIDE (GLUCOTROL) 5 MG tablet Take 1 tablet (5 mg total) by mouth 2 (two) times daily before a meal. MUST SCHEDULE ANNUAL  EXAM FOR FURTHER REFILLS 07/07/16   Lorre Munroe, NP  ivabradine (CORLANOR) 5 MG TABS tablet Take 2 tablets (10mg ) 2 hours prior to cardiac CT scan. 04/26/20   06/26/20, MD  lisinopril (PRINIVIL,ZESTRIL) 5 MG tablet Take 1 tablet (5 mg total) by mouth daily. MUST SCHEDULE ANNUAL EXAM FOR FURTHER REFILLS 07/07/16   07/09/16, NP  lovastatin (MEVACOR) 20 MG tablet TAKE 1 TABLET BY MOUTH EVERY DAY AT NIGHT 07/06/19   [provider]  metoprolol tartrate (LOPRESSOR) 100 MG tablet Take 1 tablet (100 mg total) by mouth once for 1 dose. Take 2 hours prior to your CT scan. 04/26/20 04/26/20  06/26/20, MD  Omega-3 Fatty Acids (FISH OIL) 1000 MG CAPS Take 1 capsule by mouth daily.     [provider]    Allergies Metformin  Family History  Problem Relation Age of Onset  . Diabetes Sister   . Stroke Maternal Grandmother   . Diabetes Mother   . Hypertension Father   . Cancer Neg Hx   . Hyperlipidemia Neg Hx   . Heart disease Neg Hx     Social History Social History   Tobacco Use  . Smoking status: Former Smoker    Packs/day: 1.00    Years: 7.00    Pack years: 7.00    Types: Cigarettes    Quit date: 09/15/2001  Years since quitting: 18.8  . Smokeless tobacco: Never Used  Vaping Use  . Vaping Use: Never used  Substance Use Topics  . Alcohol use: Yes    Alcohol/week: 0.0 standard drinks    Comment: occasional  . Drug use: No    Review of Systems  Constitutional: No fever/chills Eyes: No visual changes. ENT: No sore throat. Cardiovascular: Denies chest pain. Respiratory: Denies shortness of breath. Gastrointestinal: No abdominal pain.  No nausea, no vomiting.  No diarrhea.  No constipation. Genitourinary: Negative for dysuria. Musculoskeletal: Negative for back pain. Skin: Negative for rash. Neurologic: No weakness or focal deficits ____________________________________________   PHYSICAL EXAM:  VITAL SIGNS: ED Triage Vitals    Enc Vitals Group     BP 07/26/20 0845 (!) 122/91     Pulse Rate 07/26/20 0841 (!) 106     Resp 07/26/20 0846 20     Temp 07/26/20 0841 (!) 100.9 F (38.3 C)     Temp Source 07/26/20 0841 Oral     SpO2 07/26/20 0843 91 %     Weight 07/26/20 0842 170 lb (77.1 kg)     Height 07/26/20 0842 5\' 7"  (1.702 m)     Head Circumference --      Peak Flow --      Pain Score 07/26/20 0843 0     Pain Loc --      Pain Edu? --      Excl. in GC? --     Constitutional: Alert and oriented. Well appearing and in no acute distress. Eyes: Conjunctivae are normal.  Head: Atraumatic. Nose: No congestion/rhinnorhea. Mouth/Throat: Mucous membranes are moist.  Oropharynx non-erythematous. Neck: No stridor. Cardiovascular: Normal rate, regular rhythm. Grossly normal heart sounds.  Good peripheral circulation. Respiratory: Normal respiratory effort.  No retractions. Lungs CTAB. Gastrointestinal: Soft and nontender. No distention. No abdominal bruits.  Musculoskeletal: No lower extremity tenderness nor edema.  Neurologic:  Normal speech and language. No gross focal neurologic deficits are appreciated.  Skin:  Skin is warm, dry and intact. No rash noted.   ____________________________________________   LABS (all labs ordered are listed, but only abnormal results are displayed)  Labs Reviewed  COMPREHENSIVE METABOLIC PANEL  BRAIN NATRIURETIC PEPTIDE  CBC WITH DIFFERENTIAL/PLATELET  TROPONIN I (HIGH SENSITIVITY)   ____________________________________________  EKG  EKG read interpreted by me shows sinus tachycardia rate of 108 normal axis no acute ST-T wave changes there is a Q and inverted T in lead III but no S in lead I. ____________________________________________  RADIOLOGY 13/11/21, personally viewed and evaluated these images (plain radiographs) as part of my medical decision making, as well as reviewing the written report by the radiologist.  ED MD interpretation: Chest x-ray  read by radiology reviewed by me shows changes compatible with Covid pneumonia  Official radiology report(s): No results found.  ____________________________________________   PROCEDURES  Procedure(s) performed (including Critical Care):  Procedures   ____________________________________________   INITIAL IMPRESSION / ASSESSMENT AND PLAN / ED COURSE  Patient's white count is normal.  His only to have his low-grade fever.  He is known Covid positive with a chest x-ray that is consistent with Covid pneumonia.  We will get him in the hospital.  I have ordered procalcitonin just in case but I do not anticipate this will show anything other than a viral infection.  He will need to be in the hospital for oxygen and further treatment for his Covid  ____________________________________________   FINAL CLINICAL IMPRESSION(S) / ED DIAGNOSES  Final diagnoses:  Hypoxia  COVID     ED Discharge Orders    None      *Please note:  Sarvesh Meddaugh was evaluated in Emergency Department on 07/26/2020 for the symptoms described in the history of present illness. He was evaluated in the context of the global COVID-19 pandemic, which necessitated consideration that the patient might be at risk for infection with the SARS-CoV-2 virus that causes COVID-19. Institutional protocols and algorithms that pertain to the evaluation of patients at risk for COVID-19 are in a state of rapid change based on information released by regulatory bodies including the CDC and federal and state organizations. These policies and algorithms were followed during the patient's care in the ED.  Some ED evaluations and interventions may be delayed as a result of limited staffing during and the pandemic.*   Note:  This document was prepared using Dragon voice recognition software and may include unintentional dictation errors.    Arnaldo Natal, MD 07/26/20 [phone]

## 2020-07-26 NOTE — ED Triage Notes (Signed)
Pt over from [facility]. Pt had appointment today to receive the monoclonal antibodies but his O2 was decreased. [facility] reports 84-85% on RA, placed on 2L and now 91%.

## 2020-07-26 NOTE — Plan of Care (Addendum)
Pt AAox4, VS stable 2L [place] sp02 92% destats to 83% on room air. Pt up independently. C/O of cough given PRN cough syrup with good effect. Receiving IV remdesivir. Will continue to monitor.           Problem: Education: Goal: Knowledge of General Education information will improve Description: Including pain rating scale, medication(s)/side effects and non-pharmacologic comfort measures Outcome: Progressing   Problem: Health Behavior/Discharge Planning: Goal: Ability to manage health-related needs will improve Outcome: Progressing   Problem: Clinical Measurements: Goal: Ability to maintain clinical measurements within normal limits will improve Outcome: Progressing Goal: Will remain free from infection Outcome: Progressing Goal: Diagnostic test results will improve Outcome: Progressing Goal: Respiratory complications will improve Outcome: Progressing Goal: Cardiovascular complication will be avoided Outcome: Progressing   Problem: Activity: Goal: Risk for activity intolerance will decrease Outcome: Progressing   Problem: Nutrition: Goal: Adequate nutrition will be maintained Outcome: Progressing   Problem: Coping: Goal: Level of anxiety will decrease Outcome: Progressing   Problem: Elimination: Goal: Will not experience complications related to bowel motility Outcome: Progressing Goal: Will not experience complications related to urinary retention Outcome: Progressing   Problem: Pain Managment: Goal: General experience of comfort will improve Outcome: Progressing   Problem: Safety: Goal: Ability to remain free from injury will improve Outcome: Progressing   Problem: Skin Integrity: Goal: Risk for impaired skin integrity will decrease Outcome: Progressing   Problem: Education: Goal: Knowledge of risk factors and measures for prevention of condition will improve Outcome: Progressing   Problem: Coping: Goal: Psychosocial and spiritual needs will be  supported Outcome: Progressing   Problem: Respiratory: Goal: Will maintain a patent airway Outcome: Progressing Goal: Complications related to the disease process, condition or treatment will be avoided or minimized Outcome: Progressing   Problem: Education: Goal: Ability to describe self-care measures that may prevent or decrease complications (Diabetes Survival Skills Education) will improve Outcome: Progressing Goal: Individualized Educational Video(s) Outcome: Progressing   Problem: Coping: Goal: Ability to adjust to condition or change in health will improve Outcome: Progressing   Problem: Fluid Volume: Goal: Ability to maintain a balanced intake and output will improve Outcome: Progressing   Problem: Health Behavior/Discharge Planning: Goal: Ability to identify and utilize available resources and services will improve Outcome: Progressing Goal: Ability to manage health-related needs will improve Outcome: Progressing   Problem: Metabolic: Goal: Ability to maintain appropriate glucose levels will improve Outcome: Progressing   Problem: Nutritional: Goal: Maintenance of adequate nutrition will improve Outcome: Progressing Goal: Progress toward achieving an optimal weight will improve Outcome: Progressing   Problem: Tissue Perfusion: Goal: Adequacy of tissue perfusion will improve Outcome: Progressing

## 2020-07-26 NOTE — Progress Notes (Signed)
Remdesivir - Pharmacy Brief Note   O:  ALT: 17 CXR: Bilateral lung opacities compatible with COVID-19 pneumonia SpO2:   84 to 85% on room air   A/P:  Remdesivir 200 mg IVPB once followed by 100 mg IVPB daily x 4 days.   Gardner Candle, PharmD, BCPS Clinical Pharmacist 07/26/2020 10:27 AM

## 2020-07-27 LAB — GLUCOSE, CAPILLARY
Glucose-Capillary: 237 mg/dL — ABNORMAL HIGH (ref 70–99)
Glucose-Capillary: 320 mg/dL — ABNORMAL HIGH (ref 70–99)
Glucose-Capillary: 340 mg/dL — ABNORMAL HIGH (ref 70–99)
Glucose-Capillary: 416 mg/dL — ABNORMAL HIGH (ref 70–99)
Glucose-Capillary: 282 mg/dL — ABNORMAL HIGH (ref 70–99)

## 2020-07-27 LAB — FERRITIN: Ferritin: 3750 ng/mL — ABNORMAL HIGH (ref 24–336)

## 2020-07-27 LAB — CBC WITH DIFFERENTIAL/PLATELET
Abs Immature Granulocytes: 0.01 10*3/uL (ref 0.00–0.07)
Basophils Absolute: 0 10*3/uL (ref 0.0–0.1)
Basophils Relative: 0 %
Eosinophils Absolute: 0 10*3/uL (ref 0.0–0.5)
Eosinophils Relative: 0 %
HCT: 39.6 % (ref 39.0–52.0)
Hemoglobin: 13.6 g/dL (ref 13.0–17.0)
Immature Granulocytes: 0 %
Lymphocytes Relative: 15 %
Lymphs Abs: 0.4 10*3/uL — ABNORMAL LOW (ref 0.7–4.0)
MCH: 30.3 pg (ref 26.0–34.0)
MCHC: 34.3 g/dL (ref 30.0–36.0)
MCV: 88.2 fL (ref 80.0–100.0)
Monocytes Absolute: 0.3 10*3/uL (ref 0.1–1.0)
Monocytes Relative: 11 %
Neutro Abs: 1.9 10*3/uL (ref 1.7–7.7)
Neutrophils Relative %: 74 %
Platelets: 115 10*3/uL — ABNORMAL LOW (ref 150–400)
RBC: 4.49 MIL/uL (ref 4.22–5.81)
RDW: 11.3 % — ABNORMAL LOW (ref 11.5–15.5)
Smear Review: NORMAL
WBC: 2.5 10*3/uL — ABNORMAL LOW (ref 4.0–10.5)
nRBC: 0 % (ref 0.0–0.2)

## 2020-07-27 LAB — MAGNESIUM: Magnesium: 2.7 mg/dL — ABNORMAL HIGH (ref 1.7–2.4)

## 2020-07-27 LAB — PHOSPHORUS: Phosphorus: 4.2 mg/dL (ref 2.5–4.6)

## 2020-07-27 LAB — C-REACTIVE PROTEIN: CRP: 7.7 mg/dL — ABNORMAL HIGH (ref <1.0)

## 2020-07-27 LAB — HEMOGLOBIN A1C
Hgb A1c MFr Bld: 9.7 % — ABNORMAL HIGH (ref 4.8–5.6)
Mean Plasma Glucose: 231.69 mg/dL

## 2020-07-27 LAB — FIBRIN DERIVATIVES D-DIMER (ARMC ONLY): Fibrin derivatives D-dimer (ARMC): 2398.68 ng/mL (FEU) — ABNORMAL HIGH (ref 0.00–499.00)

## 2020-07-27 MED ORDER — ALUM & MAG HYDROXIDE-SIMETH 200-200-20 MG/5ML PO SUSP
30.0000 mL | Freq: Four times a day (QID) | ORAL | Status: DC | PRN
  Administered 2020-07-29: 30 mL via ORAL
  Filled 2020-07-27: qty 30

## 2020-07-27 MED ORDER — DEXAMETHASONE 4 MG PO TABS
6.0000 mg | ORAL_TABLET | Freq: Every day | ORAL | Status: DC
  Administered 2020-07-28 – 2020-07-30 (×3): 6 mg via ORAL
  Filled 2020-07-27 (×3): qty 2

## 2020-07-27 MED ORDER — INSULIN DETEMIR 100 UNIT/ML ~~LOC~~ SOLN
20.0000 [IU] | Freq: Two times a day (BID) | SUBCUTANEOUS | Status: DC
  Administered 2020-07-27 – 2020-07-28 (×3): 20 [IU] via SUBCUTANEOUS
  Filled 2020-07-27 (×5): qty 0.2

## 2020-07-27 NOTE — Plan of Care (Signed)
  Problem: Education: Goal: Knowledge of General Education information will improve Description: Including pain rating scale, medication(s)/side effects and non-pharmacologic comfort measures Outcome: Progressing   Problem: Health Behavior/Discharge Planning: Goal: Ability to manage health-related needs will improve Outcome: Progressing   Problem: Clinical Measurements: Goal: Ability to maintain clinical measurements within normal limits will improve Outcome: Progressing Goal: Will remain free from infection Outcome: Progressing Goal: Diagnostic test results will improve Outcome: Progressing Goal: Respiratory complications will improve Outcome: Progressing Goal: Cardiovascular complication will be avoided Outcome: Progressing   Problem: Activity: Goal: Risk for activity intolerance will decrease Outcome: Progressing   Problem: Nutrition: Goal: Adequate nutrition will be maintained Outcome: Progressing   Problem: Coping: Goal: Level of anxiety will decrease Outcome: Progressing   Problem: Elimination: Goal: Will not experience complications related to bowel motility Outcome: Progressing Goal: Will not experience complications related to urinary retention Outcome: Progressing   Problem: Pain Managment: Goal: General experience of comfort will improve Outcome: Progressing   Problem: Safety: Goal: Ability to remain free from injury will improve Outcome: Progressing   Problem: Skin Integrity: Goal: Risk for impaired skin integrity will decrease Outcome: Progressing   Problem: Education: Goal: Knowledge of risk factors and measures for prevention of condition will improve Outcome: Progressing   Problem: Coping: Goal: Psychosocial and spiritual needs will be supported Outcome: Progressing   Problem: Respiratory: Goal: Will maintain a patent airway Outcome: Progressing Goal: Complications related to the disease process, condition or treatment will be avoided or  minimized Outcome: Progressing   Problem: Education: Goal: Ability to describe self-care measures that may prevent or decrease complications (Diabetes Survival Skills Education) will improve Outcome: Progressing Goal: Individualized Educational Video(s) Outcome: Progressing   Problem: Coping: Goal: Ability to adjust to condition or change in health will improve Outcome: Progressing   Problem: Fluid Volume: Goal: Ability to maintain a balanced intake and output will improve Outcome: Progressing   Problem: Health Behavior/Discharge Planning: Goal: Ability to identify and utilize available resources and services will improve Outcome: Progressing Goal: Ability to manage health-related needs will improve Outcome: Progressing   Problem: Metabolic: Goal: Ability to maintain appropriate glucose levels will improve Outcome: Progressing   Problem: Nutritional: Goal: Maintenance of adequate nutrition will improve Outcome: Progressing Goal: Progress toward achieving an optimal weight will improve Outcome: Progressing   Problem: Tissue Perfusion: Goal: Adequacy of tissue perfusion will improve Outcome: Progressing

## 2020-07-27 NOTE — Progress Notes (Signed)
PROGRESS NOTE    Gordon Sexton  [id number]:[id number] DOB: December 04, 1970 DOA: 07/26/2020 PCP: Marisue Ivan, MD    Assessment & Plan:   Principal Problem:   Pneumonia due to COVID-19 virus Active Problems:   Essential hypertension   Diabetes mellitus without complication (HCC)   Thrombocytopenia (HCC)   Acute respiratory failure due to COVID-19 [facility])    Gordon Sexton is a 49 y.o. male with medical history significant for type II diabetes mellitus on oral hypoglycemic agents and hypertension who was sent to the emergency room from [facility] clinic where he had gone to receive monoclonal antibodies for his positive Covid test.  Patient's COVID-19 PCR test was positive on 07/22/20.  He has been sick for about 4 days with complaints of fever, myalgias, poor oral intake and a cough productive of clear phlegm.  He denies having any nausea, no vomiting, no diarrhea, no urinary symptoms, no dizziness or lightheadedness. He was noted to have room air pulse oximetry of 84 to 85% at the outpatient office and was placed on 2 L of oxygen with improvement in his pulse oximetry to 91%. Patient is unvaccinated   Acute hypoxic respiratory failure due to COVID-19 pneumonia  Patient is unvaccinated and his COVID-19 PCR test was positive on 07/22/20 He was noted to be hypoxic with room air pulse oximetry between 84 and 85% and improved with oxygen supplementation at 3 L with pulse oximetry in the low 90s Chest x-ray shows bilateral opacities --started on Remdesivir and solumedrol PLAN: --cont Remdesivir --taper steroid to oral decadron tomorrow since CRP not very elevated --Continue supplemental O2 to keep sats >=92%, wean as tolerated  Type 2 diabetes mellitus poorly controlled Hyperglycemia exacerbated by high-dose systemic steroids --A1c 9.7 --increase Levemir to 20u BID --SSI  Essential hypertension --BP acceptable --cont home Lisinopril  Thrombocytopenia Most likely related to acute  viral illness --monitor   DVT prophylaxis: Lovenox SQ Code Status: Full code  Family Communication:  Status is: inpatient Dispo:   The patient is from: home Anticipated d/c is to: home Anticipated d/c date is: 2-3 days Patient currently is not medically stable to d/c due to: COVID PNA on new supplemental O2, getting treatment   Subjective and Interval History:  Pt reported some cough with sputum production.  No significant dyspnea.  No pain.   Objective: Vitals:   07/27/20 0907 07/27/20 1307 07/27/20 1623 07/27/20 2024  BP: 106/79 114/76 124/76 121/79  Pulse: 88 99 97 91  Resp: 20 20 18 18   Temp: 98.7 F (37.1 C) 98.9 F (37.2 C) 98.5 F (36.9 C) 98.4 F (36.9 C)  TempSrc:  Oral  Oral  SpO2: 93% 92% (!) 83% 90%  Weight:      Height:        Intake/Output Summary (Last 24 hours) at 07/27/2020 2056 Last data filed at 07/27/2020 1700 Gross per 24 hour  Intake 960 ml  Output --  Net 960 ml   Filed Weights   07/26/20 0842  Weight: 77.1 kg    Examination:   Constitutional: NAD, AAOx3 HEENT: conjunctivae and lids normal, EOMI CV: RRR no M,R,G. Distal pulses +2.  No cyanosis.   RESP: CTA B/L over anterior, normal respiratory effort, on 2L GI: +BS, NTND Extremities: No effusions, edema, or tenderness in BLE SKIN: warm, dry and intact Neuro: II - XII grossly intact.  Sensation intact Psych: Subdued mood and affect.    Data Reviewed: I have personally reviewed following labs and imaging studies  CBC: Recent Labs  Lab 07/26/20 0855 07/27/20 0514  WBC 4.2 2.5*  NEUTROABS 3.5 1.9  HGB 13.2 13.6  HCT 37.6* 39.6  MCV 86.8 88.2  PLT 103* 115*   Basic Metabolic Panel: Recent Labs  Lab 07/26/20 0855 07/27/20 0514  NA 125*  --   K 3.5  --   CL 90*  --   CO2 23  --   GLUCOSE 425*  --   BUN 14  --   CREATININE 1.33*  --   CALCIUM 7.9*  --   MG  --  2.7*  PHOS  --  4.2   GFR: Estimated Creatinine Clearance: 61.4 mL/min (A) (by C-G formula based on  SCr of 1.33 mg/dL (H)). Liver Function Tests: Recent Labs  Lab 07/26/20 0855  AST 40  ALT 17  ALKPHOS 47  BILITOT 1.1  PROT 7.0  ALBUMIN 2.9*   No results for input(s): LIPASE, AMYLASE in the last 168 hours. No results for input(s): AMMONIA in the last 168 hours. Coagulation Profile: No results for input(s): INR, PROTIME in the last 168 hours. Cardiac Enzymes: No results for input(s): CKTOTAL, CKMB, CKMBINDEX, TROPONINI in the last 168 hours. BNP (last 3 results) No results for input(s): PROBNP in the last 8760 hours. HbA1C: Recent Labs    07/27/20 0514  HGBA1C 9.7*   CBG: Recent Labs  Lab 07/26/20 2004 07/27/20 0909 07/27/20 1205 07/27/20 1625 07/27/20 2024  GLUCAP 254* 320* 340* 416* 282*   Lipid Profile: No results for input(s): CHOL, HDL, LDLCALC, TRIG, CHOLHDL, LDLDIRECT in the last 72 hours. Thyroid Function Tests: No results for input(s): TSH, T4TOTAL, FREET4, T3FREE, THYROIDAB in the last 72 hours. Anemia Panel: Recent Labs    07/26/20 1315 07/27/20 0514  FERRITIN 5,085* 3,750*   Sepsis Labs: Recent Labs  Lab 07/26/20 1315  PROCALCITON 0.28    No results found for this or any previous visit (from the past 240 hour(s)).    Radiology Studies: DG Chest Portable 1 View  Result Date: 07/26/2020 CLINICAL DATA:  Decreased oxygen saturation.  COVID-19 positive. EXAM: PORTABLE CHEST 1 VIEW COMPARISON:  03/06/2020 FINDINGS: The cardiomediastinal silhouette is within normal limits. The lungs are hypoinflated. There are mixed interstitial and ill-defined airspace opacities bilaterally, primarily in the lung bases. No sizable pleural effusion or pneumothorax is identified. No acute osseous abnormality is seen. IMPRESSION: Bilateral lung opacities compatible with COVID-19 pneumonia with this history. Electronically Signed   By: Sebastian Ache M.D.   On: 07/26/2020 09:19     Scheduled Meds: . albuterol  2 puff Inhalation Q6H  . vitamin C  500 mg Oral Daily    . aspirin EC  81 mg Oral Daily  . enoxaparin (LOVENOX) injection  40 mg Subcutaneous Q24H  . insulin aspart  0-15 Units Subcutaneous TID WC  . insulin detemir  20 Units Subcutaneous BID  . lisinopril  5 mg Oral Daily  . methylPREDNISolone (SOLU-MEDROL) injection  0.5 mg/kg Intravenous Q12H   Followed by  . [START ON 07/29/2020] predniSONE  50 mg Oral Daily  . omega-3 acid ethyl esters  1 g Oral Daily  . pravastatin  20 mg Oral q1800  . sodium chloride flush  3 mL Intravenous Q12H  . zinc sulfate  220 mg Oral Daily   Continuous Infusions: . sodium chloride    . remdesivir 100 mg in NS 100 mL 100 mg (07/27/20 0958)     LOS: 1 day     Darlin Priestly, MD [facility] If 7PM-7AM, please  contact night-coverage 07/27/2020, 8:56 PM

## 2020-07-28 LAB — CBC
HCT: 42.4 % (ref 39.0–52.0)
Hemoglobin: 14.6 g/dL (ref 13.0–17.0)
MCH: 30.6 pg (ref 26.0–34.0)
MCHC: 34.4 g/dL (ref 30.0–36.0)
MCV: 88.9 fL (ref 80.0–100.0)
Platelets: 196 10*3/uL (ref 150–400)
RBC: 4.77 MIL/uL (ref 4.22–5.81)
RDW: 11.4 % — ABNORMAL LOW (ref 11.5–15.5)
WBC: 9.1 10*3/uL (ref 4.0–10.5)
nRBC: 0 % (ref 0.0–0.2)

## 2020-07-28 LAB — BASIC METABOLIC PANEL
Anion gap: 10 (ref 5–15)
BUN: 22 mg/dL — ABNORMAL HIGH (ref 6–20)
CO2: 29 mmol/L (ref 22–32)
Calcium: 8.2 mg/dL — ABNORMAL LOW (ref 8.9–10.3)
Chloride: 96 mmol/L — ABNORMAL LOW (ref 98–111)
Creatinine, Ser: 1.24 mg/dL (ref 0.61–1.24)
GFR, Estimated: >60 mL/min (ref >60)
Glucose, Bld: 132 mg/dL — ABNORMAL HIGH (ref 70–99)
Potassium: 3.4 mmol/L — ABNORMAL LOW (ref 3.5–5.1)
Sodium: 135 mmol/L (ref 135–145)

## 2020-07-28 LAB — C-REACTIVE PROTEIN: CRP: 2.6 mg/dL — ABNORMAL HIGH (ref <1.0)

## 2020-07-28 LAB — GLUCOSE, CAPILLARY
Glucose-Capillary: 146 mg/dL — ABNORMAL HIGH (ref 70–99)
Glucose-Capillary: 245 mg/dL — ABNORMAL HIGH (ref 70–99)
Glucose-Capillary: 283 mg/dL — ABNORMAL HIGH (ref 70–99)
Glucose-Capillary: 255 mg/dL — ABNORMAL HIGH (ref 70–99)

## 2020-07-28 LAB — FIBRIN DERIVATIVES D-DIMER (ARMC ONLY): Fibrin derivatives D-dimer (ARMC): 1664.66 ng/mL (FEU) — ABNORMAL HIGH (ref 0.00–499.00)

## 2020-07-28 LAB — MAGNESIUM: Magnesium: 2.8 mg/dL — ABNORMAL HIGH (ref 1.7–2.4)

## 2020-07-28 MED ORDER — INSULIN ASPART 100 UNIT/ML ~~LOC~~ SOLN
5.0000 [IU] | Freq: Three times a day (TID) | SUBCUTANEOUS | Status: DC
  Administered 2020-07-28 – 2020-07-30 (×6): 5 [IU] via SUBCUTANEOUS
  Filled 2020-07-28 (×6): qty 1

## 2020-07-28 MED ORDER — POTASSIUM CHLORIDE CRYS ER 20 MEQ PO TBCR
40.0000 meq | EXTENDED_RELEASE_TABLET | Freq: Once | ORAL | Status: AC
  Administered 2020-07-28: 12:00:00 40 meq via ORAL
  Filled 2020-07-28: qty 2

## 2020-07-28 MED ORDER — INSULIN DETEMIR 100 UNIT/ML ~~LOC~~ SOLN
15.0000 [IU] | Freq: Two times a day (BID) | SUBCUTANEOUS | Status: DC
  Administered 2020-07-28 – 2020-07-29 (×2): 15 [IU] via SUBCUTANEOUS
  Filled 2020-07-28 (×3): qty 0.15

## 2020-07-28 NOTE — Plan of Care (Signed)
  Problem: Education: Goal: Knowledge of General Education information will improve Description Including pain rating scale, medication(s)/side effects and non-pharmacologic comfort measures Outcome: Progressing   

## 2020-07-28 NOTE — Progress Notes (Signed)
PROGRESS NOTE    Gordon Sexton  QVZ:[id number] DOB: March 04, 1971 DOA: 07/26/2020 PCP: Marisue Ivan, MD    Assessment & Plan:   Principal Problem:   Pneumonia due to COVID-19 virus Active Problems:   Essential hypertension   Diabetes mellitus without complication (HCC)   Thrombocytopenia (HCC)   Acute respiratory failure due to COVID-19 [facility])    Gordon Sexton is a 49 y.o. male with medical history significant for type II diabetes mellitus on oral hypoglycemic agents and hypertension who was sent to the emergency room from [facility] clinic where he had gone to receive monoclonal antibodies for his positive Covid test.  Patient's COVID-19 PCR test was positive on 07/22/20.  He has been sick for about 4 days with complaints of fever, myalgias, poor oral intake and a cough productive of clear phlegm.  He denies having any nausea, no vomiting, no diarrhea, no urinary symptoms, no dizziness or lightheadedness. He was noted to have room air pulse oximetry of 84 to 85% at the outpatient office and was placed on 2 L of oxygen with improvement in his pulse oximetry to 91%. Patient is unvaccinated   Acute hypoxic respiratory failure due to COVID-19 pneumonia  Patient is unvaccinated and his COVID-19 PCR test was positive on 07/22/20 He was noted to be hypoxic with room air pulse oximetry between 84 and 85% and improved with oxygen supplementation at 3 L with pulse oximetry in the low 90s Chest x-ray shows bilateral opacities --started on Remdesivir and solumedrol PLAN: --cont Remdesivir --cont oral decadron --Continue supplemental O2 to keep sats >=92%, wean as tolerated (currently at 3L)  Type 2 diabetes mellitus poorly controlled Hyperglycemia exacerbated by high-dose systemic steroids --A1c 9.7 --reduce Levemir to 15u BID --add mealtime 5u TID --SSI  Essential hypertension --BP wnl --cont home Lisinopril  Thrombocytopenia, resolved Most likely related to acute viral  illness --monitor  Hypokalemia --replete with oral potassium PRN   DVT prophylaxis: Lovenox SQ Code Status: Full code  Family Communication:  Status is: inpatient Dispo:   The patient is from: home Anticipated d/c is to: home Anticipated d/c date is: 2-3 days Patient currently is not medically stable to d/c due to: COVID PNA on new supplemental 3L O2, getting treatment   Subjective and Interval History:  Pt had more cough in the morning, with sputum turning yellow in color.     Objective: Vitals:   07/28/20 0054 07/28/20 0540 07/28/20 0748 07/28/20 1100  BP: 110/81 115/85 121/75 114/82  Pulse: 75 (!) 105 (!) 110 93  Resp: 20 20 20  (!) 22  Temp: 98.5 F (36.9 C) 98.2 F (36.8 C) 99.2 F (37.3 C) 98.8 F (37.1 C)  TempSrc: Oral Oral    SpO2: 91% 92% 91% 94%  Weight:      Height:        Intake/Output Summary (Last 24 hours) at 07/28/2020 1309 Last data filed at 07/27/2020 1700 Gross per 24 hour  Intake 360 ml  Output --  Net 360 ml   Filed Weights   07/26/20 0842  Weight: 77.1 kg    Examination:   Constitutional: NAD, AAOx3 HEENT: conjunctivae and lids normal, EOMI CV: No cyanosis.   RESP: no wheezes.  On 3L. Extremities: No effusions, edema in BLE SKIN: warm, dry and intact Neuro: II - XII grossly intact.   Psych: Subdued mood and affect.      Data Reviewed: I have personally reviewed following labs and imaging studies  CBC: Recent Labs  Lab 07/26/20 0855  07/27/20 0514 07/28/20 0550  WBC 4.2 2.5* 9.1  NEUTROABS 3.5 1.9  --   HGB 13.2 13.6 14.6  HCT 37.6* 39.6 42.4  MCV 86.8 88.2 88.9  PLT 103* 115* 196   Basic Metabolic Panel: Recent Labs  Lab 07/26/20 0855 07/27/20 0514 07/28/20 0550  NA 125*  --  135  K 3.5  --  3.4*  CL 90*  --  96*  CO2 23  --  29  GLUCOSE 425*  --  132*  BUN 14  --  22*  CREATININE 1.33*  --  1.24  CALCIUM 7.9*  --  8.2*  MG  --  2.7* 2.8*  PHOS  --  4.2  --    GFR: Estimated Creatinine Clearance:  65.9 mL/min (by C-G formula based on SCr of 1.24 mg/dL). Liver Function Tests: Recent Labs  Lab 07/26/20 0855  AST 40  ALT 17  ALKPHOS 47  BILITOT 1.1  PROT 7.0  ALBUMIN 2.9*   No results for input(s): LIPASE, AMYLASE in the last 168 hours. No results for input(s): AMMONIA in the last 168 hours. Coagulation Profile: No results for input(s): INR, PROTIME in the last 168 hours. Cardiac Enzymes: No results for input(s): CKTOTAL, CKMB, CKMBINDEX, TROPONINI in the last 168 hours. BNP (last 3 results) No results for input(s): PROBNP in the last 8760 hours. HbA1C: Recent Labs    07/27/20 0514  HGBA1C 9.7*   CBG: Recent Labs  Lab 07/27/20 1205 07/27/20 1625 07/27/20 2024 07/28/20 0745 07/28/20 1150  GLUCAP 340* 416* 282* 146* 245*   Lipid Profile: No results for input(s): CHOL, HDL, LDLCALC, TRIG, CHOLHDL, LDLDIRECT in the last 72 hours. Thyroid Function Tests: No results for input(s): TSH, T4TOTAL, FREET4, T3FREE, THYROIDAB in the last 72 hours. Anemia Panel: Recent Labs    07/26/20 1315 07/27/20 0514  FERRITIN 5,085* 3,750*   Sepsis Labs: Recent Labs  Lab 07/26/20 1315  PROCALCITON 0.28    No results found for this or any previous visit (from the past 240 hour(s)).    Radiology Studies: No results found.   Scheduled Meds: . albuterol  2 puff Inhalation Q6H  . vitamin C  500 mg Oral Daily  . aspirin EC  81 mg Oral Daily  . dexamethasone  6 mg Oral Daily  . enoxaparin (LOVENOX) injection  40 mg Subcutaneous Q24H  . insulin aspart  0-15 Units Subcutaneous TID WC  . insulin aspart  5 Units Subcutaneous TID WC  . insulin detemir  15 Units Subcutaneous BID  . lisinopril  5 mg Oral Daily  . omega-3 acid ethyl esters  1 g Oral Daily  . pravastatin  20 mg Oral q1800  . sodium chloride flush  3 mL Intravenous Q12H  . zinc sulfate  220 mg Oral Daily   Continuous Infusions: . sodium chloride    . remdesivir 100 mg in NS 100 mL 100 mg (07/28/20 0846)      LOS: 2 days     Darlin Priestly, MD [facility] If 7PM-7AM, please contact night-coverage 07/28/2020, 1:09 PM

## 2020-07-28 NOTE — Plan of Care (Signed)
Pt AAox4, VS stable 3L [place] sp02 92% destats to 83% on room air. Pt up independently. C/O of cough given PRN cough syrup with good effect. Receiving IV remdesivir. Pt with good appetite. Will continue to monitor.            Problem: Education: Goal: Knowledge of General Education information will improve Description: Including pain rating scale, medication(s)/side effects and non-pharmacologic comfort measures Outcome: Progressing   Problem: Health Behavior/Discharge Planning: Goal: Ability to manage health-related needs will improve Outcome: Progressing   Problem: Clinical Measurements: Goal: Ability to maintain clinical measurements within normal limits will improve Outcome: Progressing Goal: Will remain free from infection Outcome: Progressing Goal: Diagnostic test results will improve Outcome: Progressing Goal: Respiratory complications will improve Outcome: Progressing Goal: Cardiovascular complication will be avoided Outcome: Progressing   Problem: Activity: Goal: Risk for activity intolerance will decrease Outcome: Progressing   Problem: Nutrition: Goal: Adequate nutrition will be maintained Outcome: Progressing   Problem: Coping: Goal: Level of anxiety will decrease Outcome: Progressing   Problem: Elimination: Goal: Will not experience complications related to bowel motility Outcome: Progressing Goal: Will not experience complications related to urinary retention Outcome: Progressing   Problem: Pain Managment: Goal: General experience of comfort will improve Outcome: Progressing   Problem: Safety: Goal: Ability to remain free from injury will improve Outcome: Progressing   Problem: Skin Integrity: Goal: Risk for impaired skin integrity will decrease Outcome: Progressing   Problem: Education: Goal: Knowledge of risk factors and measures for prevention of condition will improve Outcome: Progressing   Problem: Coping: Goal: Psychosocial and  spiritual needs will be supported Outcome: Progressing   Problem: Respiratory: Goal: Will maintain a patent airway Outcome: Progressing Goal: Complications related to the disease process, condition or treatment will be avoided or minimized Outcome: Progressing   Problem: Education: Goal: Ability to describe self-care measures that may prevent or decrease complications (Diabetes Survival Skills Education) will improve Outcome: Progressing Goal: Individualized Educational Video(s) Outcome: Progressing   Problem: Coping: Goal: Ability to adjust to condition or change in health will improve Outcome: Progressing   Problem: Fluid Volume: Goal: Ability to maintain a balanced intake and output will improve Outcome: Progressing   Problem: Health Behavior/Discharge Planning: Goal: Ability to identify and utilize available resources and services will improve Outcome: Progressing Goal: Ability to manage health-related needs will improve Outcome: Progressing   Problem: Metabolic: Goal: Ability to maintain appropriate glucose levels will improve Outcome: Progressing   Problem: Nutritional: Goal: Maintenance of adequate nutrition will improve Outcome: Progressing Goal: Progress toward achieving an optimal weight will improve Outcome: Progressing   Problem: Tissue Perfusion: Goal: Adequacy of tissue perfusion will improve Outcome: Progressing

## 2020-07-29 LAB — BASIC METABOLIC PANEL
Anion gap: 9 (ref 5–15)
BUN: 15 mg/dL (ref 6–20)
CO2: 28 mmol/L (ref 22–32)
Calcium: 8 mg/dL — ABNORMAL LOW (ref 8.9–10.3)
Chloride: 99 mmol/L (ref 98–111)
Creatinine, Ser: 1 mg/dL (ref 0.61–1.24)
GFR, Estimated: >60 mL/min (ref >60)
Glucose, Bld: 122 mg/dL — ABNORMAL HIGH (ref 70–99)
Potassium: 3.8 mmol/L (ref 3.5–5.1)
Sodium: 136 mmol/L (ref 135–145)

## 2020-07-29 LAB — GLUCOSE, CAPILLARY
Glucose-Capillary: 189 mg/dL — ABNORMAL HIGH (ref 70–99)
Glucose-Capillary: 216 mg/dL — ABNORMAL HIGH (ref 70–99)
Glucose-Capillary: 256 mg/dL — ABNORMAL HIGH (ref 70–99)
Glucose-Capillary: 335 mg/dL — ABNORMAL HIGH (ref 70–99)

## 2020-07-29 LAB — MAGNESIUM: Magnesium: 2.8 mg/dL — ABNORMAL HIGH (ref 1.7–2.4)

## 2020-07-29 LAB — CBC
HCT: 41 % (ref 39.0–52.0)
Hemoglobin: 14.3 g/dL (ref 13.0–17.0)
MCH: 30.9 pg (ref 26.0–34.0)
MCHC: 34.9 g/dL (ref 30.0–36.0)
MCV: 88.6 fL (ref 80.0–100.0)
Platelets: 233 10*3/uL (ref 150–400)
RBC: 4.63 MIL/uL (ref 4.22–5.81)
RDW: 11.5 % (ref 11.5–15.5)
WBC: 8.8 10*3/uL (ref 4.0–10.5)
nRBC: 0 % (ref 0.0–0.2)

## 2020-07-29 LAB — C-REACTIVE PROTEIN: CRP: 2.4 mg/dL — ABNORMAL HIGH (ref <1.0)

## 2020-07-29 LAB — FIBRIN DERIVATIVES D-DIMER (ARMC ONLY): Fibrin derivatives D-dimer (ARMC): 1114 ng/mL (FEU) — ABNORMAL HIGH (ref 0.00–499.00)

## 2020-07-29 MED ORDER — INSULIN DETEMIR 100 UNIT/ML ~~LOC~~ SOLN
10.0000 [IU] | Freq: Two times a day (BID) | SUBCUTANEOUS | Status: DC
  Administered 2020-07-29 – 2020-07-30 (×2): 10 [IU] via SUBCUTANEOUS
  Filled 2020-07-29 (×3): qty 0.1

## 2020-07-29 NOTE — Progress Notes (Signed)
PROGRESS NOTE    Gordon Sexton  MVE:[id number] DOB: 1971-05-23 DOA: 07/26/2020 PCP: Marisue Ivan, MD    Assessment & Plan:   Principal Problem:   Pneumonia due to COVID-19 virus Active Problems:   Essential hypertension   Diabetes mellitus without complication (HCC)   Thrombocytopenia (HCC)   Acute respiratory failure due to COVID-19 [facility])    Gordon Sexton is a 49 y.o. male with medical history significant for type II diabetes mellitus on oral hypoglycemic agents and hypertension who was sent to the emergency room from [facility] clinic where he had gone to receive monoclonal antibodies for his positive Covid test.  Patient's COVID-19 PCR test was positive on 07/22/20.  He has been sick for about 4 days with complaints of fever, myalgias, poor oral intake and a cough productive of clear phlegm.  He denies having any nausea, no vomiting, no diarrhea, no urinary symptoms, no dizziness or lightheadedness. He was noted to have room air pulse oximetry of 84 to 85% at the outpatient office and was placed on 2 L of oxygen with improvement in his pulse oximetry to 91%. Patient is unvaccinated   Acute hypoxic respiratory failure due to COVID-19 pneumonia  Patient is unvaccinated and his COVID-19 PCR test was positive on 07/22/20 He was noted to be hypoxic with room air pulse oximetry between 84 and 85% and improved with oxygen supplementation at 3 L with pulse oximetry in the low 90s Chest x-ray shows bilateral opacities --started on Remdesivir and solumedrol --CRP 7.7 on presentation, trending down PLAN: --cont Remdesivir --cont oral decadron --Continue supplemental O2 to keep sats >=92%, wean as tolerated (currently at 3L)  Type 2 diabetes mellitus poorly controlled Hyperglycemia exacerbated by high-dose systemic steroids --A1c 9.7 --reduce Levemir to 10u BID --cont mealtime 5u TID --SSI  Essential hypertension --BP wnl --cont home Lisionpril  Thrombocytopenia,  resolved Most likely related to acute viral illness  Hypokalemia --replete PRN   DVT prophylaxis: Lovenox SQ Code Status: Full code  Family Communication:  Status is: inpatient Dispo:   The patient is from: home Anticipated d/c is to: home Anticipated d/c date is: 1-2 days Patient currently is not medically stable to d/c due to: COVID PNA on new supplemental 3L O2, getting Remdesivir   Subjective and Interval History:  O2 requirement stable at 3L.  Pt said he felt well and wanted to go home.     Objective: Vitals:   07/29/20 0228 07/29/20 0649 07/29/20 0902 07/29/20 1157  BP: 108/82 102/70 129/85 132/86  Pulse: 78 79  (!) 105  Resp: 18 18 18 20   Temp: 98.8 F (37.1 C) 98.6 F (37 C) 98.6 F (37 C) 98.9 F (37.2 C)  TempSrc: Oral Oral Oral Oral  SpO2: 93% 93% 92% (!) 84%  Weight:      Height:        Intake/Output Summary (Last 24 hours) at 07/29/2020 1445 Last data filed at 07/29/2020 0400 Gross per 24 hour  Intake 560 ml  Output 600 ml  Net -40 ml   Filed Weights   07/26/20 0842  Weight: 77.1 kg    Examination:   Constitutional: NAD, AAOx3 HEENT: conjunctivae and lids normal, EOMI CV: No cyanosis.   RESP: normal respiratory effort, on 3L Extremities: No effusions, edema in BLE SKIN: warm, dry and intact Neuro: II - XII grossly intact.     Data Reviewed: I have personally reviewed following labs and imaging studies  CBC: Recent Labs  Lab 07/26/20 0855 07/27/20 0514 07/28/20  0550 07/29/20 0712  WBC 4.2 2.5* 9.1 8.8  NEUTROABS 3.5 1.9  --   --   HGB 13.2 13.6 14.6 14.3  HCT 37.6* 39.6 42.4 41.0  MCV 86.8 88.2 88.9 88.6  PLT 103* 115* 196 233   Basic Metabolic Panel: Recent Labs  Lab 07/26/20 0855 07/27/20 0514 07/28/20 0550 07/29/20 0712  NA 125*  --  135 136  K 3.5  --  3.4* 3.8  CL 90*  --  96* 99  CO2 23  --  29 28  GLUCOSE 425*  --  132* 122*  BUN 14  --  22* 15  CREATININE 1.33*  --  1.24 1.00  CALCIUM 7.9*  --  8.2* 8.0*    MG  --  2.7* 2.8* 2.8*  PHOS  --  4.2  --   --    GFR: Estimated Creatinine Clearance: 81.7 mL/min (by C-G formula based on SCr of 1 mg/dL). Liver Function Tests: Recent Labs  Lab 07/26/20 0855  AST 40  ALT 17  ALKPHOS 47  BILITOT 1.1  PROT 7.0  ALBUMIN 2.9*   No results for input(s): LIPASE, AMYLASE in the last 168 hours. No results for input(s): AMMONIA in the last 168 hours. Coagulation Profile: No results for input(s): INR, PROTIME in the last 168 hours. Cardiac Enzymes: No results for input(s): CKTOTAL, CKMB, CKMBINDEX, TROPONINI in the last 168 hours. BNP (last 3 results) No results for input(s): PROBNP in the last 8760 hours. HbA1C: Recent Labs    07/27/20 0514  HGBA1C 9.7*   CBG: Recent Labs  Lab 07/28/20 1150 07/28/20 1635 07/28/20 2105 07/29/20 0850 07/29/20 1154  GLUCAP 245* 283* 255* 189* 216*   Lipid Profile: No results for input(s): CHOL, HDL, LDLCALC, TRIG, CHOLHDL, LDLDIRECT in the last 72 hours. Thyroid Function Tests: No results for input(s): TSH, T4TOTAL, FREET4, T3FREE, THYROIDAB in the last 72 hours. Anemia Panel: Recent Labs    07/27/20 0514  FERRITIN 3,750*   Sepsis Labs: Recent Labs  Lab 07/26/20 1315  PROCALCITON 0.28    No results found for this or any previous visit (from the past 240 hour(s)).    Radiology Studies: No results found.   Scheduled Meds:  albuterol  2 puff Inhalation Q6H   vitamin C  500 mg Oral Daily   aspirin EC  81 mg Oral Daily   dexamethasone  6 mg Oral Daily   enoxaparin (LOVENOX) injection  40 mg Subcutaneous Q24H   insulin aspart  0-15 Units Subcutaneous TID WC   insulin aspart  5 Units Subcutaneous TID WC   insulin detemir  10 Units Subcutaneous BID   lisinopril  5 mg Oral Daily   omega-3 acid ethyl esters  1 g Oral Daily   pravastatin  20 mg Oral q1800   sodium chloride flush  3 mL Intravenous Q12H   zinc sulfate  220 mg Oral Daily   Continuous Infusions:  sodium chloride      remdesivir 100 mg in NS 100 mL 100 mg (07/29/20 0955)     LOS: 3 days     Darlin Priestly, MD [facility] If 7PM-7AM, please contact night-coverage 07/29/2020, 2:45 PM

## 2020-07-30 LAB — MAGNESIUM: Magnesium: 2.5 mg/dL — ABNORMAL HIGH (ref 1.7–2.4)

## 2020-07-30 LAB — BASIC METABOLIC PANEL
Anion gap: 10 (ref 5–15)
BUN: 13 mg/dL (ref 6–20)
CO2: 27 mmol/L (ref 22–32)
Calcium: 8.1 mg/dL — ABNORMAL LOW (ref 8.9–10.3)
Chloride: 98 mmol/L (ref 98–111)
Creatinine, Ser: 0.97 mg/dL (ref 0.61–1.24)
GFR, Estimated: >60 mL/min (ref >60)
Glucose, Bld: 136 mg/dL — ABNORMAL HIGH (ref 70–99)
Potassium: 3.8 mmol/L (ref 3.5–5.1)
Sodium: 135 mmol/L (ref 135–145)

## 2020-07-30 LAB — CBC
HCT: 40 % (ref 39.0–52.0)
Hemoglobin: 13.8 g/dL (ref 13.0–17.0)
MCH: 30.4 pg (ref 26.0–34.0)
MCHC: 34.5 g/dL (ref 30.0–36.0)
MCV: 88.1 fL (ref 80.0–100.0)
Platelets: 279 10*3/uL (ref 150–400)
RBC: 4.54 MIL/uL (ref 4.22–5.81)
RDW: 11.5 % (ref 11.5–15.5)
WBC: 9.5 10*3/uL (ref 4.0–10.5)
nRBC: 0 % (ref 0.0–0.2)

## 2020-07-30 LAB — GLUCOSE, CAPILLARY
Glucose-Capillary: 127 mg/dL — ABNORMAL HIGH (ref 70–99)
Glucose-Capillary: 173 mg/dL — ABNORMAL HIGH (ref 70–99)
Glucose-Capillary: 271 mg/dL — ABNORMAL HIGH (ref 70–99)

## 2020-07-30 LAB — FIBRIN DERIVATIVES D-DIMER (ARMC ONLY): Fibrin derivatives D-dimer (ARMC): 939.02 ng/mL (FEU) — ABNORMAL HIGH (ref 0.00–499.00)

## 2020-07-30 LAB — C-REACTIVE PROTEIN: CRP: 3.9 mg/dL — ABNORMAL HIGH (ref <1.0)

## 2020-07-30 MED ORDER — INSULIN ASPART 100 UNIT/ML ~~LOC~~ SOLN
10.0000 [IU] | Freq: Three times a day (TID) | SUBCUTANEOUS | Status: DC
  Administered 2020-07-30 (×2): 10 [IU] via SUBCUTANEOUS
  Filled 2020-07-30 (×2): qty 1

## 2020-07-30 MED ORDER — ASCORBIC ACID 500 MG PO TABS: 500.0000 mg | ORAL_TABLET | Freq: Every day | ORAL | 0 refills | Status: AC

## 2020-07-30 MED ORDER — ALBUTEROL SULFATE HFA 108 (90 BASE) MCG/ACT IN AERS: 2.0000 | INHALATION_SPRAY | Freq: Four times a day (QID) | RESPIRATORY_TRACT | Status: AC

## 2020-07-30 MED ORDER — INSULIN DETEMIR 100 UNIT/ML ~~LOC~~ SOLN
5.0000 [IU] | Freq: Two times a day (BID) | SUBCUTANEOUS | Status: DC
  Filled 2020-07-30 (×2): qty 0.05

## 2020-07-30 MED ORDER — DEXAMETHASONE 6 MG PO TABS: 6.0000 mg | ORAL_TABLET | Freq: Every day | ORAL | 0 refills | Status: DC

## 2020-07-30 MED ORDER — ZINC SULFATE 220 (50 ZN) MG PO CAPS: 220.0000 mg | ORAL_CAPSULE | Freq: Every day | ORAL | 0 refills | Status: AC

## 2020-07-30 NOTE — TOC Initial Note (Signed)
Transition of Care [facility]) - Initial/Assessment Note    Patient Details  Name: Gordon Sexton MRN: [id number] Date of Birth: [birth year]  Transition of Care [facility]) CM/SW Contact:    Allayne Butcher, RN Phone Number: 07/30/2020, 2:45 PM  Clinical Narrative:                 Patient has been medically cleared to discharge home.  Patient admitted to the hospital with COVID requiring oxygen.  Patient qualifies for home oxygen, referral given to [facility] with [employer].  Oxygen will be brought up to the patient's room before discharge.  Pulse oximeter provided to the patient from the [facility] department.   Expected Discharge Plan: Home/Self Care Barriers to Discharge: Barriers Resolved   Patient Goals and CMS Choice Patient states their goals for this hospitalization and ongoing recovery are:: Wants to go home      Expected Discharge Plan and Services Expected Discharge Plan: Home/Self Care   Discharge Planning Services: CM Consult   Living arrangements for the past 2 months: Single Family Home Expected Discharge Date: 07/30/20               DME Arranged: Oxygen DME Agency: [employer] Date DME Agency Contacted: 07/30/20 Time DME Agency Contacted: 1433 Representative spoke with at DME Agency: Oletha Cruel            Prior Living Arrangements/Services Living arrangements for the past 2 months: Single Family Home Lives with:: Self Patient language and need for interpreter reviewed:: Yes Do you feel safe going back to the place where you live?: Yes      Need for Family Participation in Patient Care: Yes (Comment) (COVID) Care giver support system in place?: Yes (comment) (sister)   Criminal Activity/Legal Involvement Pertinent to Current Situation/Hospitalization: No - Comment as needed  Activities of Daily Living Home Assistive Devices/Equipment: Eyeglasses ADL Screening (condition at time of admission) Patient's cognitive ability adequate to safely complete daily activities?: Yes Is the  patient deaf or have difficulty hearing?: No Does the patient have difficulty seeing, even when wearing glasses/contacts?: No Does the patient have difficulty concentrating, remembering, or making decisions?: No Patient able to express need for assistance with ADLs?: Yes Does the patient have difficulty dressing or bathing?: No Independently performs ADLs?: Yes (appropriate for developmental age) Does the patient have difficulty walking or climbing stairs?: No Weakness of Legs: None Weakness of Arms/Hands: None  Permission Sought/Granted Permission sought to share information with : Case Manager, Oceanographer granted to share information with : Yes, Verbal Permission Granted     Permission granted to share info w AGENCY: [employer]        Emotional Assessment       Orientation: : Oriented to Self, Oriented to Place, Oriented to  Time, Oriented to Situation Alcohol / Substance Use: Not Applicable Psych Involvement: No (comment)  Admission diagnosis:  Hypoxia [R09.02] COVID [U07.1] Pneumonia due to COVID-19 virus [U07.1, J12.82] Patient Active Problem List   Diagnosis Date Noted  . Pneumonia due to COVID-19 virus 07/26/2020  . Thrombocytopenia (HCC) 07/26/2020  . Acute respiratory failure due to COVID-19 (HCC) 07/26/2020  . Diabetes mellitus without complication (HCC)   . Essential hypertension 01/11/2019  . Other chest pain 09/17/2018  . Hypertriglyceridemia 12/26/2014  . Type 2 diabetes mellitus with hyperglycemia (HCC) 09/26/2014  . Chronic prostatitis 04/25/2013  . Increased frequency of urination 04/25/2013  . Penile pain 04/25/2013  . Urinary urgency 04/25/2013   PCP:  Marisue Ivan, MD Pharmacy:  [id number] [place], [place] - [address] [place] [address] Phone: [phone] Fax: [phone]     Social Determinants of Health (SDOH) Interventions    Readmission Risk Interventions No flowsheet  data found.

## 2020-07-30 NOTE — Progress Notes (Signed)
Pt being discharged home, discharge instructions reviewed with pt, states understanding, pt with no complaints, o2 delivered and is in place, awaiting ride

## 2020-07-30 NOTE — Discharge Summary (Signed)
Physician Discharge Summary   Gordon Sexton  male DOB: 10-16-1970  TMA:[id number]  PCP: Marisue Ivan, MD  Admit date: 07/26/2020 Discharge date: 07/30/2020  Admitted From: home Disposition:  home CODE STATUS: Full code  Discharge Instructions    Discharge instructions   Complete by: As directed    You have finished your 5 days of COVID treatment with Remdesivir.  You still need 3 liters of supplemental oxygen when you walk around, so we are sending you home with oxygen.  Please follow up with your outpatient doctor to monitor your oxygen needs, and expectation is that you will get off oxygen after your lungs recover.  Please continue 7 more days of steroid Decadron as directed to help reduce inflammation in your body.   Dr. Darlin Priestly [facility] Course:  For full details, please see H&P, progress notes, consult notes and ancillary notes.  Briefly,  Gordon McGheeis a 49 y.o.malewith medical history significant fortype II diabetes mellitus on oral hypoglycemic agentsand hypertension who was sent to the emergency room from [facility] clinic where he had gone to receive monoclonal antibodies for his positive Covid test. Patient's COVID-19 PCR test was positive on 07/22/20.He has been sick for about 4 days with complaints of fever, myalgias, poor oral intake and a cough productive of clear phlegm.   He was noted to have room air pulse oximetry of 84 to 85% at the outpatient office and was placed on 2 L of oxygen with improvement in his pulse oximetry to 91%.  Patient is unvaccinated   Acute hypoxic respiratory failure due to COVID-19 pneumonia  Chest x-ray showed bilateral opacities.  Pt was started on Remdesivir and solumedrol.  CRP 7.7 on presentation, trended down with steroid.  Pt completed 5 doses of Remdesivir but still needed 3L O2 with ambulation prior to discharge, but since pt really wanted to go home, he was discharged on 3L supplemental O2.  Pt  also discharged on 7 more days of oral Decadron.  Type 2 diabetes mellitus poorly controlled Hyperglycemia exacerbated by high-dose systemic steroids A1c 9.7.  Prior to discharge, pt was receiving Levemir to 10u BID and mealtime 5u TID as well as SSI.  Pt was discharged on home regimen of glipizide.  Essential hypertension BP wnl.  Continued home Lisionpril  Thrombocytopenia, resolved Most likely related to acute viral illness  Hypokalemia Repleted PRN   Discharge Diagnoses:  Principal Problem:   Pneumonia due to COVID-19 virus Active Problems:   Essential hypertension   Diabetes mellitus without complication (HCC)   Thrombocytopenia (HCC)   Acute respiratory failure due to COVID-19 [facility])    Discharge Instructions:  Allergies as of 07/30/2020      Reactions   Metformin Diarrhea      Medication List    STOP taking these medications   aluminum-magnesium hydroxide-simethicone 200-200-20 MG/5ML Susp Commonly known as: MAALOX   ivabradine 5 MG Tabs tablet Commonly known as: CORLANOR   metoprolol tartrate 100 MG tablet Commonly known as: LOPRESSOR     TAKE these medications   albuterol 108 (90 Base) MCG/ACT inhaler Commonly known as: VENTOLIN HFA Inhale 2 puffs into the lungs every 6 (six) hours for 7 days.   ascorbic acid 500 MG tablet Commonly known as: VITAMIN C Take 1 tablet (500 mg total) by mouth daily. Start taking on: July 31, 2020   aspirin 81 MG EC tablet Take 81 mg by mouth daily.   dexamethasone 6 MG tablet  Commonly known as: DECADRON Take 1 tablet (6 mg total) by mouth daily for 7 days. Steroid to help reduce inflammation. Start taking on: July 31, 2020   Fish Oil 1000 MG Caps Take 1 capsule by mouth daily.   glipiZIDE 5 MG tablet Commonly known as: GLUCOTROL Take 1 tablet (5 mg total) by mouth 2 (two) times daily before a meal. MUST SCHEDULE ANNUAL EXAM FOR FURTHER REFILLS   lisinopril 5 MG tablet Commonly known as:  ZESTRIL Take 1 tablet (5 mg total) by mouth daily. MUST SCHEDULE ANNUAL EXAM FOR FURTHER REFILLS   lovastatin 20 MG tablet Commonly known as: MEVACOR TAKE 1 TABLET BY MOUTH EVERY DAY AT NIGHT   zinc sulfate 220 (50 Zn) MG capsule Take 1 capsule (220 mg total) by mouth daily. Start taking on: July 31, 2020        Follow-up Information    Marisue Ivan, MD. Schedule an appointment as soon as possible for a visit in 1 week(s).   Specialty: Family Medicine Contact information: [address] [facility] [place] [address] [phone]        Debbe Odea, MD .   Specialties: Cardiology, Radiology Contact information: [address] [place] [address] [phone]               Allergies  Allergen Reactions  . Metformin Diarrhea     The results of significant diagnostics from this hospitalization (including imaging, microbiology, ancillary and laboratory) are listed below for reference.   Consultations:   Procedures/Studies: DG Chest Portable 1 View  Result Date: 07/26/2020 CLINICAL DATA:  Decreased oxygen saturation.  COVID-19 positive. EXAM: PORTABLE CHEST 1 VIEW COMPARISON:  03/06/2020 FINDINGS: The cardiomediastinal silhouette is within normal limits. The lungs are hypoinflated. There are mixed interstitial and ill-defined airspace opacities bilaterally, primarily in the lung bases. No sizable pleural effusion or pneumothorax is identified. No acute osseous abnormality is seen. IMPRESSION: Bilateral lung opacities compatible with COVID-19 pneumonia with this history. Electronically Signed   By: Sebastian Ache M.D.   On: 07/26/2020 09:19      Labs: BNP (last 3 results) Recent Labs    07/26/20 0855  BNP 22.3   Basic Metabolic Panel: Recent Labs  Lab 07/26/20 0855 07/27/20 0514 07/28/20 0550 07/29/20 0712 07/30/20 0605  NA 125*  --  135 136 135  K 3.5  --  3.4* 3.8 3.8  CL 90*  --  96* 99 98  CO2 23  --   29 28 27   GLUCOSE 425*  --  132* 122* 136*  BUN 14  --  22* 15 13  CREATININE 1.33*  --  1.24 1.00 0.97  CALCIUM 7.9*  --  8.2* 8.0* 8.1*  MG  --  2.7* 2.8* 2.8* 2.5*  PHOS  --  4.2  --   --   --    Liver Function Tests: Recent Labs  Lab 07/26/20 0855  AST 40  ALT 17  ALKPHOS 47  BILITOT 1.1  PROT 7.0  ALBUMIN 2.9*   No results for input(s): LIPASE, AMYLASE in the last 168 hours. No results for input(s): AMMONIA in the last 168 hours. CBC: Recent Labs  Lab 07/26/20 0855 07/27/20 0514 07/28/20 0550 07/29/20 0712 07/30/20 0605  WBC 4.2 2.5* 9.1 8.8 9.5  NEUTROABS 3.5 1.9  --   --   --   HGB 13.2 13.6 14.6 14.3 13.8  HCT 37.6* 39.6 42.4 41.0 40.0  MCV 86.8 88.2 88.9 88.6 88.1  PLT 103* 115* 196 233 279   Cardiac Enzymes: No results for input(s): CKTOTAL, CKMB, CKMBINDEX, TROPONINI in the last 168 hours. BNP: Invalid input(s): POCBNP CBG: Recent Labs  Lab 07/29/20 1154 07/29/20 1610 07/29/20 2130 07/30/20 0751 07/30/20 1158  GLUCAP 216* 256* 335* 127* 173*   D-Dimer No results for input(s): DDIMER in the last 72 hours. Hgb A1c No results for input(s): HGBA1C in the last 72 hours. Lipid Profile No results for input(s): CHOL, HDL, LDLCALC, TRIG, CHOLHDL, LDLDIRECT in the last 72 hours. Thyroid function studies No results for input(s): TSH, T4TOTAL, T3FREE, THYROIDAB in the last 72 hours.  Invalid input(s): FREET3 Anemia work up No results for input(s): VITAMINB12, FOLATE, FERRITIN, TIBC, IRON, RETICCTPCT in the last 72 hours. Urinalysis    Component Value Date/Time   COLORURINE YELLOW 07/13/2014 0305   APPEARANCEUR Clear 06/29/2017 0905   LABSPEC 1.011 07/13/2014 0305   PHURINE 5.0 07/13/2014 0305   GLUCOSEU Trace (A) 06/29/2017 0905   HGBUR NEGATIVE 07/13/2014 0305   BILIRUBINUR Negative 06/29/2017 0905   KETONESUR NEGATIVE 07/13/2014 0305   PROTEINUR 2+ (A) 06/29/2017 0905   PROTEINUR NEGATIVE 07/13/2014 0305   UROBILINOGEN negative 09/27/2014  1210   UROBILINOGEN 0.2 07/13/2014 0305   NITRITE Negative 06/29/2017 0905   NITRITE NEGATIVE 07/13/2014 0305   LEUKOCYTESUR Negative 06/29/2017 0905   Sepsis Labs Invalid input(s): PROCALCITONIN,  WBC,  LACTICIDVEN Microbiology No results found for this or any previous visit (from the past 240 hour(s)).   Total time spend on discharging this patient, including the last patient exam, discussing the hospital stay, instructions for ongoing care as it relates to all pertinent caregivers, as well as preparing the medical discharge records, prescriptions, and/or referrals as applicable, is 40 minutes.    Darlin Priestly, MD  [facility] 07/30/2020, 2:22 PM

## 2020-07-30 NOTE — Plan of Care (Signed)
  Problem: Education: Goal: Knowledge of General Education information will improve Description: Including pain rating scale, medication(s)/side effects and non-pharmacologic comfort measures Outcome: Progressing   Problem: Health Behavior/Discharge Planning: Goal: Ability to manage health-related needs will improve Outcome: Progressing   Problem: Clinical Measurements: Goal: Ability to maintain clinical measurements within normal limits will improve Outcome: Progressing Goal: Will remain free from infection Outcome: Progressing Goal: Diagnostic test results will improve Outcome: Progressing Goal: Respiratory complications will improve Outcome: Progressing Goal: Cardiovascular complication will be avoided Outcome: Progressing   Problem: Activity: Goal: Risk for activity intolerance will decrease Outcome: Progressing   Problem: Nutrition: Goal: Adequate nutrition will be maintained Outcome: Progressing   Problem: Coping: Goal: Level of anxiety will decrease Outcome: Progressing   Problem: Elimination: Goal: Will not experience complications related to bowel motility Outcome: Progressing   Problem: Pain Managment: Goal: General experience of comfort will improve Outcome: Progressing   Problem: Education: Goal: Knowledge of risk factors and measures for prevention of condition will improve Outcome: Progressing   Problem: Coping: Goal: Psychosocial and spiritual needs will be supported Outcome: Progressing   Problem: Respiratory: Goal: Will maintain a patent airway Outcome: Progressing Goal: Complications related to the disease process, condition or treatment will be avoided or minimized Outcome: Progressing   Problem: Education: Goal: Ability to describe self-care measures that may prevent or decrease complications (Diabetes Survival Skills Education) will improve Outcome: Progressing   Problem: Coping: Goal: Ability to adjust to condition or change in health  will improve Outcome: Progressing   Problem: Fluid Volume: Goal: Ability to maintain a balanced intake and output will improve Outcome: Progressing   Problem: Health Behavior/Discharge Planning: Goal: Ability to identify and utilize available resources and services will improve Outcome: Progressing Goal: Ability to manage health-related needs will improve Outcome: Progressing   Problem: Metabolic: Goal: Ability to maintain appropriate glucose levels will improve Outcome: Progressing   Problem: Nutritional: Goal: Maintenance of adequate nutrition will improve Outcome: Progressing Goal: Progress toward achieving an optimal weight will improve Outcome: Progressing   Problem: Tissue Perfusion: Goal: Adequacy of tissue perfusion will improve Outcome: Progressing

## 2020-07-30 NOTE — Progress Notes (Signed)
SATURATION QUALIFICATIONS: (This note is used to comply with regulatory documentation for home oxygen)  Patient Saturations on Room Air at Rest = 85%   Patient Saturations on 4Liters of oxygen while Ambulating = 95%

## 2020-08-05 NOTE — Progress Notes (Signed)
Office Visit    Patient Name: [name] Date of Encounter: 08/06/2020  Primary Care Provider:  Marisue Ivan, MD Primary Cardiologist:  Debbe Odea, MD  Chief Complaint    Chief Complaint  Patient presents with  . Other    hospital follow up - Patient c/o SOB. Meds reviewed verbally with patient.    49 yo male with history of CP, HTN, HLD, DM2, and seen today for follow-up after recent [facility] admission for hypoxia in the setting of COVID-19.   Past Medical History    Past Medical History:  Diagnosis Date  . Diabetes mellitus without complication (HCC)   . Hypertension    History reviewed. No pertinent surgical history.  Allergies  Allergies  Allergen Reactions  . Metformin Diarrhea    History of Present Illness    Gordon Sexton is a 49 y.o. male with PMH as above. He was self referred to [facility] 03/2020 for CP that he reported as ongoing x2 years.   He was seen by Dr. Azucena Cecil 04/02/20 and reported severe 8/10 CP that woke him from sleep 2 weeks earlier. He went to [facility] but did not stay for work-up. He reported sx frequency qod and unrelated to exertion, as well as SOB unrelated to exertion.  He was subsequently scheduled for echo and coronary CTA. He was continued on lisinopril and statin.   Echo showed EF 60-65%, NRWMA, G1DD, RVSP 34.16mmHg, and mild MR.   MPI showed EF estimated 60% and ruled low risk scan.  Coronary CTA not yet performed.   He was admitted 07/26/2020 -07/30/20 for COVID-19 and scheduled with cardiology at discharge.  He did not see cardiology during his admission.  Today, 08/06/2020, he denies any chest pain, racing heart rate, palpitations, lower extremity edema, abdominal distention, or early satiety.  He notes shortness of breath and dyspnea on exertion, improved with the oxygen that he wears now 24/7.  He does not have a follow-up appointment with pulmonology yet.  He is scheduled to see his  primary care physician soon.  No signs or symptoms of bleeding.  Home Medications    Current Outpatient Medications on File Prior to Visit  Medication Sig Dispense Refill  . albuterol (VENTOLIN HFA) 108 (90 Base) MCG/ACT inhaler Inhale 2 puffs into the lungs every 6 (six) hours for 7 days.    Marland Kitchen ascorbic acid (VITAMIN C) 500 MG tablet Take 1 tablet (500 mg total) by mouth daily. 30 tablet 0  . aspirin 81 MG EC tablet Take 81 mg by mouth daily.     Marland Kitchen glipiZIDE (GLUCOTROL) 5 MG tablet Take 1 tablet (5 mg total) by mouth 2 (two) times daily before a meal. MUST SCHEDULE ANNUAL EXAM FOR FURTHER REFILLS 60 tablet 1  . lisinopril (PRINIVIL,ZESTRIL) 5 MG tablet Take 1 tablet (5 mg total) by mouth daily. MUST SCHEDULE ANNUAL EXAM FOR FURTHER REFILLS 30 tablet 5  . lovastatin (MEVACOR) 20 MG tablet TAKE 1 TABLET BY MOUTH EVERY DAY AT NIGHT    . Omega-3 Fatty Acids (FISH OIL) 1000 MG CAPS Take 1 capsule by mouth daily.     . TRULICITY 0.75 MG/0.5ML SOPN SMARTSIG:0.5 Milliliter(s) SUB-Q Once a Week    . zinc sulfate 220 (50 Zn) MG capsule Take 1 capsule (220 mg total) by mouth daily. 30 capsule 0   No current facility-administered medications on file prior to visit.    Review of Systems    He denies chest pain, palpitations, pnd, orthopnea,  n, v, dizziness, syncope, edema, weight gain, or early satiety.  He reports dyspnea and shortness of breath s/p COVID-19 infection.   All other systems reviewed and are otherwise negative except as noted above.  Physical Exam    VS:  BP (!) 100/58 (BP Location: Left Arm, Patient Position: Sitting, Cuff Size: Normal)   Pulse (!) 111   Ht 5\' 6"  (1.676 m)   Wt 164 lb (74.4 kg)   SpO2 94%   BMI 26.47 kg/m  , BMI Body mass index is 26.47 kg/m. GEN: Well nourished, well developed, in no acute distress. HEENT: normal. Neck: Supple, no JVD, carotid bruits, or masses. Cardiac: Tachycardic but regular, no murmurs, rubs, or gallops. No clubbing, cyanosis, edema.   Radials/DP/PT 2+ and equal bilaterally.  Respiratory:  Respirations regular and unlabored, clear to auscultation bilaterally. GI: Soft, nontender, nondistended, BS + x 4. MS: no deformity or atrophy. Skin: warm and dry, no rash. Neuro:  Strength and sensation are intact. Psych: Normal affect.  Accessory Clinical Findings    ECG personally reviewed by me today -sinus tachycardia, 111 bpm, PR interval 120 ms, QTC 435 ms, LVH, cannot rule out inferior infarct as seen on previous EKGs- no acute changes.  VITALS Reviewed today   Temp Readings from Last 3 Encounters:  07/30/20 98.6 F (37 C)  03/13/16 98.4 F (36.9 C) (Oral)  01/08/16 98.2 F (36.8 C) (Oral)   BP Readings from Last 3 Encounters:  08/06/20 (!) 100/58  07/30/20 108/72  05/10/20 108/82   Pulse Readings from Last 3 Encounters:  08/06/20 (!) 111  07/30/20 87  05/10/20 74    Wt Readings from Last 3 Encounters:  08/06/20 164 lb (74.4 kg)  07/26/20 170 lb (77.1 kg)  04/02/20 178 lb (80.7 kg)     LABS  reviewed today   Lab Results  Component Value Date   WBC 9.5 07/30/2020   HGB 13.8 07/30/2020   HCT 40.0 07/30/2020   MCV 88.1 07/30/2020   PLT 279 07/30/2020   Lab Results  Component Value Date   CREATININE 0.97 07/30/2020   BUN 13 07/30/2020   NA 135 07/30/2020   K 3.8 07/30/2020   CL 98 07/30/2020   CO2 27 07/30/2020   Lab Results  Component Value Date   ALT 17 07/26/2020   AST 40 07/26/2020   ALKPHOS 47 07/26/2020   BILITOT 1.1 07/26/2020   Lab Results  Component Value Date   CHOL 144 01/08/2016   HDL 41.90 01/08/2016   LDLCALC 74 01/08/2016   TRIG 140.0 01/08/2016   CHOLHDL 3 01/08/2016    Lab Results  Component Value Date   HGBA1C 9.7 (H) 07/27/2020   No results found for: TSH   STUDIES/PROCEDURES reviewed today   MPI 05/17/20 Pharmacological myocardial perfusion imaging study with no significant  ischemia Normal wall motion, EF estimated at 60% No EKG changes concerning for  ischemia at peak stress or in recovery. CT attenuation correction images with no aortic atherosclerosis or coronary calcification Low risk scan   Echo 05/03/20 1. Left ventricular ejection fraction, by estimation, is 60 to 65%. The  left ventricle has normal function. The left ventricle has no regional  wall motion abnormalities. Left ventricular diastolic parameters are  consistent with Grade I diastolic  dysfunction (impaired relaxation).  2. Right ventricular systolic function is normal. The right ventricular  size is normal. There is normal pulmonary artery systolic pressure. The  estimated right ventricular systolic pressure is 34.0 mmHg.  3. Mild mitral valve regurgitation.   Assessment & Plan    HTN --BP well controlled at 100/58.  Continue current lisinopril.  HLD --Continue current statin.  06/2020 LDL was 77 with total cholesterol 135.  History of chest pain, resolved --No further chest pain reported.  Risk factors for CAD include hypertension, hyperlipidemia, and DM2.  Echo with EF 60 to 65%, G1 DD, RVSP 34.0, and mild MR.  MPI ruled low risk.  No further ischemic work-up indicated at this time.  Continue risk factor modification and current medications, including ASA and statin.  Sinus tachycardia --HR elevated in the setting of recent COVID-19 infection / hypoxia (and as he did not bring his oxygen with him to the office, so feels this is greatly contributing to his elevated right).  He will let us know if his HR remains elevated once recovered from his illness. Would avoid BB for rate control given his current BP and also given his recent COVID-19.  History of COVID-19 --Follow-up with PCP /pulmonology.  DM2 --Glycemic control per PCP for risk factor modification.  Disposition: RTC as needed   Lennon Alstrom, PA-C 08/06/2020

## 2020-08-06 ENCOUNTER — Other Ambulatory Visit: Payer: Self-pay

## 2020-08-06 ENCOUNTER — Encounter: Payer: Self-pay | Admitting: Physician Assistant

## 2020-08-06 ENCOUNTER — Ambulatory Visit: Payer: BC Managed Care – PPO | Admitting: Physician Assistant

## 2020-08-06 VITALS — BP 100/58 | HR 111 | Ht 66.0 in | Wt 164.0 lb

## 2020-08-06 DIAGNOSIS — Z87898 Personal history of other specified conditions: Secondary | ICD-10-CM

## 2020-08-06 DIAGNOSIS — Z8616 Personal history of COVID-19: Secondary | ICD-10-CM

## 2020-08-06 DIAGNOSIS — I10 Essential (primary) hypertension: Secondary | ICD-10-CM | POA: Diagnosis not present

## 2020-08-06 DIAGNOSIS — R000 Tachycardia, unspecified: Secondary | ICD-10-CM

## 2020-08-06 DIAGNOSIS — E785 Hyperlipidemia, unspecified: Secondary | ICD-10-CM

## 2020-08-06 NOTE — Patient Instructions (Signed)
Medication Instructions:  Your physician recommends that you continue on your current medications as directed. Please refer to the Current Medication list given to you today.  *If you need a refill on your cardiac medications before your next appointment, please call your pharmacy*   Lab Work: None ordered.    Testing/Procedures: None ordered.   Follow-Up: At [facility], you and your health needs are our priority.  As part of our continuing mission to provide you with exceptional heart care, we have created designated Provider Care Teams.  These Care Teams include your primary Cardiologist (physician) and Advanced Practice Providers (APPs -  Physician Assistants and Nurse Practitioners) who all work together to provide you with the care you need, when you need it.  We recommend signing up for the patient portal called "MyChart".  Sign up information is provided on this After Visit Summary.  MyChart is used to connect with patients for Virtual Visits (Telemedicine).  Patients are able to view lab/test results, encounter notes, upcoming appointments, etc.  Non-urgent messages can be sent to your provider as well.   To learn more about what you can do with MyChart, go to [email or web address].    Your next appointment:    Follow up as needed  The format for your next appointment:   In Person  Provider:   You may see Debbe Odea, MD or one of the following Advanced Practice Providers on your designated Care Team:    Nicolasa Ducking, NP  Eula Listen, PA-C  Marisue Ivan, PA-C  Cadence Fox, [place]  Gillian Shields, NP

## 2020-10-09 ENCOUNTER — Other Ambulatory Visit: Payer: Self-pay

## 2021-07-11 IMAGING — CR DG CHEST 2V
2 series · 2 of 2 positions shown · non-contrast
Comparison: March 13, 2016.

CLINICAL DATA: Chest pain.

EXAM:
CHEST - 2 VIEW

[w chest pa]
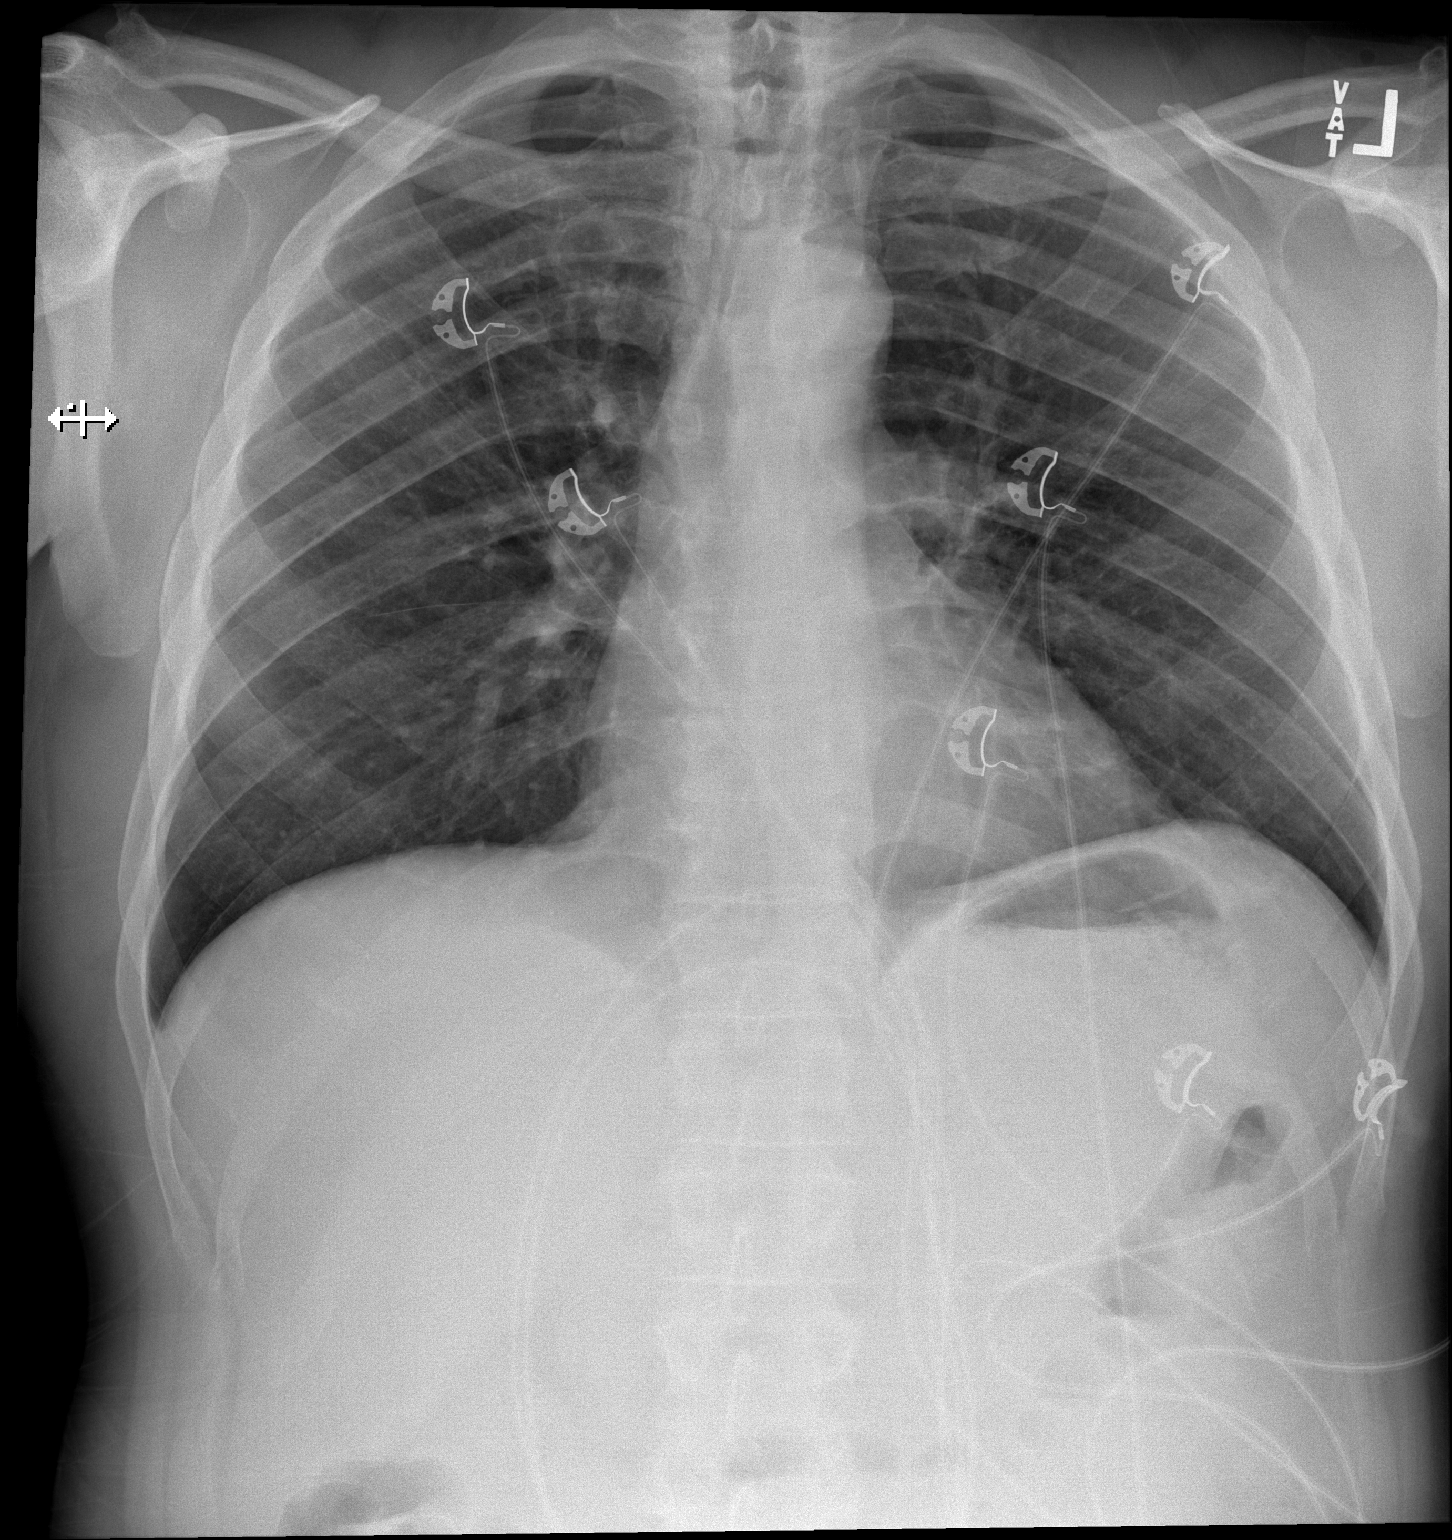

[w chest lat]
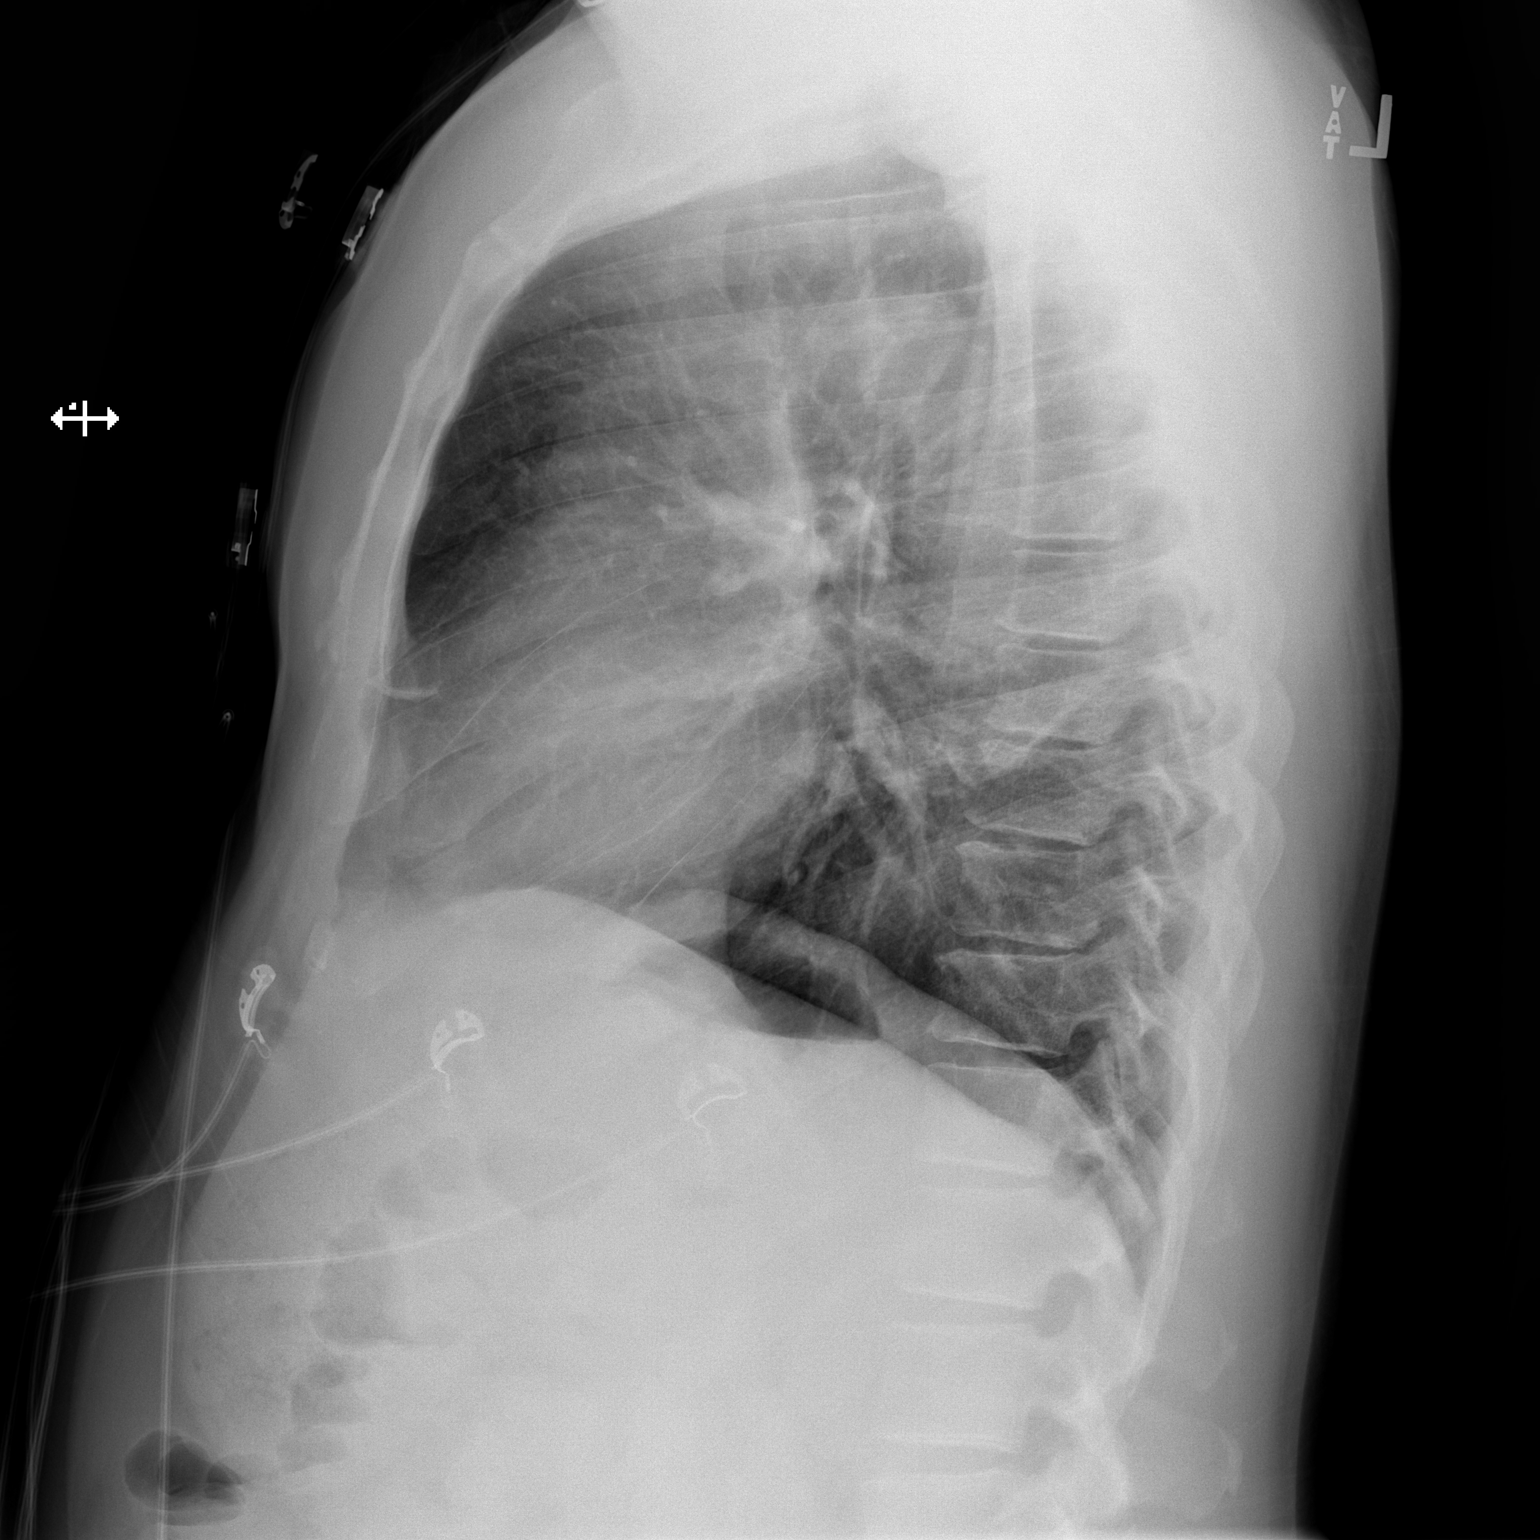

[2 of 2 positions shown; findings below may reference images not displayed]

FINDINGS: The heart size and mediastinal contours are within normal limits.
Both lungs are clear. No pneumothorax or pleural effusion is noted.
The visualized skeletal structures are unremarkable.
IMPRESSION: No active cardiopulmonary disease.

## 2021-11-30 IMAGING — DX DG CHEST 1V PORT
1 series · 1 of 1 positions shown · non-contrast
Comparison: 03/06/2020

CLINICAL DATA: Decreased oxygen saturation.  DP24U-14 positive.

EXAM:
PORTABLE CHEST 1 VIEW

[chest ap]
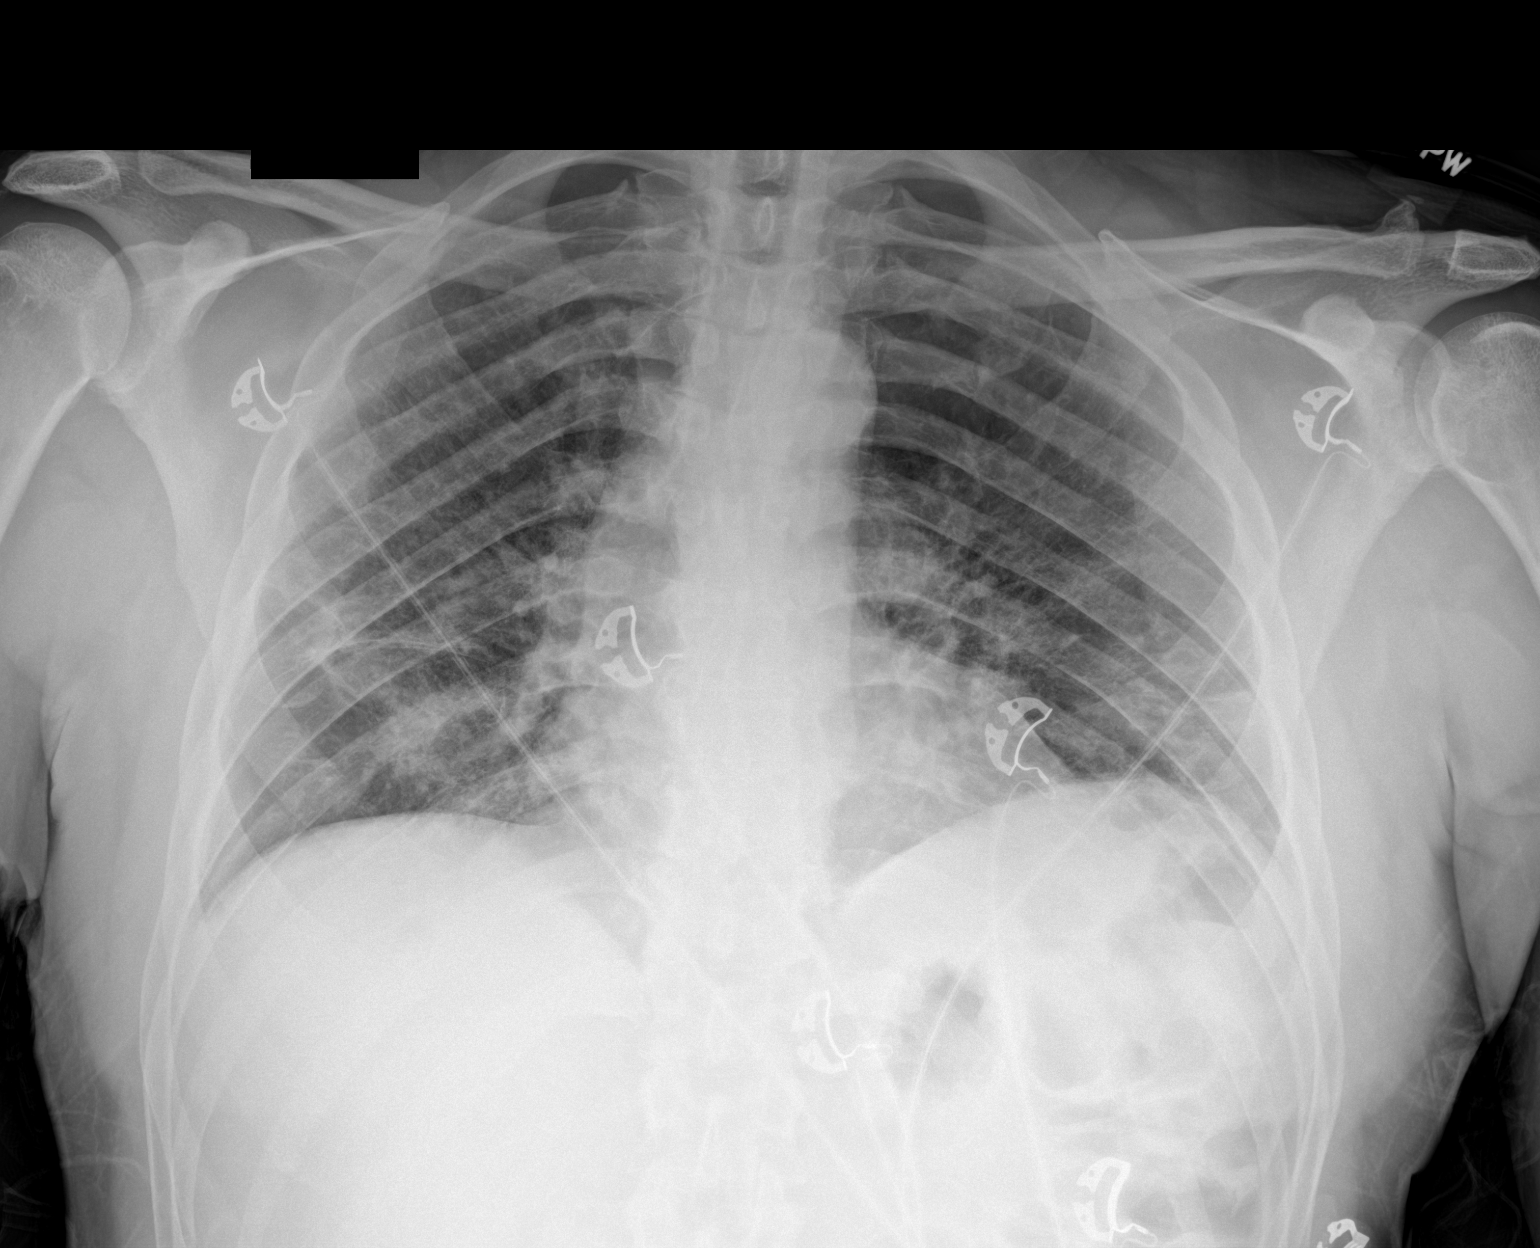

[1 of 1 positions shown; findings below may reference images not displayed]

FINDINGS: The cardiomediastinal silhouette is within normal limits. The lungs
are hypoinflated. There are mixed interstitial and ill-defined
airspace opacities bilaterally, primarily in the lung bases. No
sizable pleural effusion or pneumothorax is identified. No acute
osseous abnormality is seen.
IMPRESSION: Bilateral lung opacities compatible with DP24U-14 pneumonia with
this history.

## 2022-04-21 ENCOUNTER — Ambulatory Visit: Payer: Self-pay | Admitting: Nurse Practitioner

## 2022-04-21 ENCOUNTER — Encounter: Payer: Self-pay | Admitting: Nurse Practitioner

## 2022-04-21 DIAGNOSIS — Z113 Encounter for screening for infections with a predominantly sexual mode of transmission: Secondary | ICD-10-CM

## 2022-04-21 LAB — HM HIV SCREENING LAB: HM HIV Screening: NEGATIVE

## 2022-04-21 LAB — GRAM STAIN

## 2022-04-21 LAB — HM HEPATITIS C SCREENING LAB: HM Hepatitis Screen: NEGATIVE

## 2022-04-21 NOTE — Progress Notes (Signed)
[facility] Department STI clinic/screening visit  Subjective:  Gordon Sexton is a 51 y.o. male being seen today for an STI screening visit. The patient reports they do have symptoms.    Patient has the following medical conditions:   Patient Active Problem List   Diagnosis Date Noted   Pneumonia due to COVID-19 virus 07/26/2020   Thrombocytopenia (HCC) 07/26/2020   Acute respiratory failure due to COVID-19 [facility]) 07/26/2020   Diabetes mellitus without complication (HCC)    Essential hypertension 01/11/2019   Other chest pain 09/17/2018   Hypertriglyceridemia 12/26/2014   Type 2 diabetes mellitus with hyperglycemia (HCC) 09/26/2014   Chronic prostatitis 04/25/2013   Increased frequency of urination 04/25/2013   Penile pain 04/25/2013   Urinary urgency 04/25/2013     Chief Complaint  Patient presents with   SEXUALLY TRANSMITTED DISEASE    Screening- patient stated he was exposed to an std possibly gonorrhea patient stated has burning when urinating, had some discharge and is having back pain      HPI  Patient reports to clinic today for STD screening.  Patient reports with lower abdominal pain that began 4 days ago, and discharge and dysuria that began one week ago.    Does the patient or their partner desires a pregnancy in the next year? No  Screening for MPX risk: Does the patient have an unexplained rash? No Is the patient MSM? No Does the patient endorse multiple sex partners or anonymous sex partners? No Did the patient have close or sexual contact with a person diagnosed with MPX? No Has the patient traveled outside the [place] where MPX is endemic? No Is there a high clinical suspicion for MPX-- evidenced by one of the following No  -Unlikely to be chickenpox  -Lymphadenopathy  -Rash that present in same phase of evolution on any given body part   See flowsheet for further details and programmatic requirements.    There is no immunization history on  file for this patient.   The following portions of the patient's history were reviewed and updated as appropriate: allergies, current medications, past medical history, past social history, past surgical history and problem list.  Objective:  There were no vitals filed for this visit.  Physical Exam Constitutional:      Appearance: Normal appearance.  HENT:     Head: Normocephalic. No abrasion, masses or laceration. Hair is normal.     Right Ear: External ear normal.     Left Ear: External ear normal.     Nose: Nose normal.     Mouth/Throat:     Lips: Pink.     Mouth: Mucous membranes are moist. No oral lesions.     Dentition: No dental caries.     Pharynx: No pharyngeal swelling, oropharyngeal exudate, posterior oropharyngeal erythema or uvula swelling.     Tonsils: No tonsillar exudate or tonsillar abscesses.     Comments: Missing dentition  Eyes:     General: Lids are normal.        Right eye: No discharge.        Left eye: No discharge.     Conjunctiva/sclera: Conjunctivae normal.     Right eye: No exudate.    Left eye: No exudate. Abdominal:     General: Abdomen is flat.     Palpations: Abdomen is soft.     Tenderness: There is no abdominal tenderness. There is no rebound.  Genitourinary:    Pubic Area: No rash or pubic lice.  Penis: Normal and circumcised. No erythema or discharge.      Testes: Normal.        Right: Mass or tenderness not present.        Left: Mass or tenderness not present.     Rectum: Normal.     Comments: Discharge amount: small Color: clear  Musculoskeletal:     Cervical back: Full passive range of motion without pain, normal range of motion and neck supple.  Lymphadenopathy:     Cervical: No cervical adenopathy.     Right cervical: No superficial, deep or posterior cervical adenopathy.    Left cervical: No superficial, deep or posterior cervical adenopathy.     Upper Body:     Right upper body: No supraclavicular, axillary or  epitrochlear adenopathy.     Left upper body: No supraclavicular, axillary or epitrochlear adenopathy.     Lower Body: No right inguinal adenopathy. No left inguinal adenopathy.  Skin:    General: Skin is warm and dry.     Findings: No lesion or rash.  Neurological:     Mental Status: He is alert and oriented to person, place, and time.  Psychiatric:        Attention and Perception: Attention normal.        Mood and Affect: Mood normal.        Speech: Speech normal.        Behavior: Behavior normal. Behavior is cooperative.       Assessment and Plan:  Gordon Sexton is a 51 y.o. male presenting to the [facility] Department for STI screening  1. Screening examination for venereal disease -51 year old male in clinic today for STD screening. -Patient does have STI symptoms Patient accepted all screenings including  oral GC, urine CT/GC, gram stain and bloodwork for HIV/RPR.  Patient meets criteria for HepB screening? No. Ordered? No - low risk  Patient meets criteria for HepC screening? Yes. Ordered? Yes Recommended condom use with all sex Discussed importance of condom use for STI prevent  Treat gram stain per standing order Discussed time line for [facility] results and that patient will be called with positive results and encouraged patient to call if he had not heard in 2 weeks Recommended returning for continued or worsening symptoms.    - HIV/HCV [facility] [facility] - Syphilis Serology, [place] [facility] - Gram stain - Gonococcus culture - Chlamydia/GC NAA, Confirmation    Total time spent: 30 minutes   Return if symptoms worsen or fail to improve.   Glenna Fellows, FNP

## 2022-04-21 NOTE — Progress Notes (Signed)
Patient seen for STD screening. Gram stain reviewed, no tx per standing orders. Condoms given.

## 2022-04-24 LAB — CHLAMYDIA/GC NAA, CONFIRMATION
Chlamydia trachomatis, NAA: NEGATIVE
Neisseria gonorrhoeae, NAA: NEGATIVE

## 2022-04-26 LAB — GONOCOCCUS CULTURE
